# Patient Record
Sex: Male | Born: 1945 | Race: White | Hispanic: No | Marital: Married | Smoking: Former smoker
Health system: Southern US, Community
[De-identification: ages and names within clinical notes are randomized; demographics above are authoritative.]

## PROBLEM LIST (undated history)

## (undated) DIAGNOSIS — N200 Calculus of kidney: Secondary | ICD-10-CM

## (undated) DIAGNOSIS — T4145XA Adverse effect of unspecified anesthetic, initial encounter: Secondary | ICD-10-CM

## (undated) DIAGNOSIS — M2550 Pain in unspecified joint: Secondary | ICD-10-CM

## (undated) DIAGNOSIS — I6529 Occlusion and stenosis of unspecified carotid artery: Secondary | ICD-10-CM

## (undated) DIAGNOSIS — T8859XA Other complications of anesthesia, initial encounter: Secondary | ICD-10-CM

## (undated) DIAGNOSIS — D649 Anemia, unspecified: Secondary | ICD-10-CM

## (undated) DIAGNOSIS — I10 Essential (primary) hypertension: Secondary | ICD-10-CM

## (undated) DIAGNOSIS — E785 Hyperlipidemia, unspecified: Secondary | ICD-10-CM

## (undated) DIAGNOSIS — C801 Malignant (primary) neoplasm, unspecified: Secondary | ICD-10-CM

## (undated) DIAGNOSIS — IMO0002 Reserved for concepts with insufficient information to code with codable children: Secondary | ICD-10-CM

## (undated) DIAGNOSIS — J189 Pneumonia, unspecified organism: Secondary | ICD-10-CM

## (undated) DIAGNOSIS — K759 Inflammatory liver disease, unspecified: Secondary | ICD-10-CM

## (undated) HISTORY — PX: COLONOSCOPY: SHX174

## (undated) HISTORY — DX: Essential (primary) hypertension: I10

## (undated) HISTORY — PX: TONSILLECTOMY: SUR1361

## (undated) HISTORY — DX: Anemia, unspecified: D64.9

## (undated) HISTORY — DX: Hyperlipidemia, unspecified: E78.5

## (undated) HISTORY — PX: APPENDECTOMY: SHX54

## (undated) HISTORY — DX: Occlusion and stenosis of unspecified carotid artery: I65.29

## (undated) HISTORY — DX: Malignant (primary) neoplasm, unspecified: C80.1

## (undated) HISTORY — PX: OTHER SURGICAL HISTORY: SHX169

---

## 1998-06-21 ENCOUNTER — Encounter: Admission: RE | Admit: 1998-06-21 | Discharge: 1998-09-19 | Payer: Self-pay | Admitting: Family Medicine

## 1999-01-16 ENCOUNTER — Encounter: Admission: RE | Admit: 1999-01-16 | Discharge: 1999-04-16 | Payer: Self-pay | Admitting: Family Medicine

## 1999-02-05 ENCOUNTER — Encounter: Payer: Self-pay | Admitting: Emergency Medicine

## 1999-02-05 ENCOUNTER — Emergency Department (HOSPITAL_COMMUNITY): Admission: EM | Admit: 1999-02-05 | Discharge: 1999-02-05 | Payer: Self-pay | Admitting: Emergency Medicine

## 2000-07-13 ENCOUNTER — Encounter: Payer: Self-pay | Admitting: Family Medicine

## 2000-07-13 ENCOUNTER — Encounter: Admission: RE | Admit: 2000-07-13 | Discharge: 2000-07-13 | Payer: Self-pay | Admitting: Family Medicine

## 2000-08-17 ENCOUNTER — Ambulatory Visit (HOSPITAL_COMMUNITY): Admission: RE | Admit: 2000-08-17 | Discharge: 2000-08-17 | Payer: Self-pay | Admitting: Gastroenterology

## 2000-08-17 ENCOUNTER — Encounter (INDEPENDENT_AMBULATORY_CARE_PROVIDER_SITE_OTHER): Payer: Self-pay | Admitting: *Deleted

## 2005-06-09 ENCOUNTER — Encounter: Admission: RE | Admit: 2005-06-09 | Discharge: 2005-06-09 | Payer: Self-pay | Admitting: Gastroenterology

## 2005-12-24 ENCOUNTER — Encounter: Payer: Self-pay | Admitting: Cardiovascular Disease

## 2005-12-24 ENCOUNTER — Inpatient Hospital Stay (HOSPITAL_COMMUNITY): Admission: EM | Admit: 2005-12-24 | Discharge: 2005-12-25 | Payer: Self-pay | Admitting: Emergency Medicine

## 2005-12-24 ENCOUNTER — Ambulatory Visit: Payer: Self-pay | Admitting: Cardiovascular Disease

## 2006-01-30 ENCOUNTER — Ambulatory Visit: Payer: Self-pay | Admitting: Internal Medicine

## 2006-03-04 ENCOUNTER — Ambulatory Visit: Payer: Self-pay | Admitting: Internal Medicine

## 2007-07-26 ENCOUNTER — Ambulatory Visit: Payer: Self-pay | Admitting: Internal Medicine

## 2007-11-30 ENCOUNTER — Ambulatory Visit: Payer: Self-pay | Admitting: Vascular Surgery

## 2007-12-28 ENCOUNTER — Ambulatory Visit: Admission: RE | Admit: 2007-12-28 | Discharge: 2008-01-25 | Payer: Self-pay | Admitting: Radiation Oncology

## 2008-01-03 ENCOUNTER — Encounter: Admission: RE | Admit: 2008-01-03 | Discharge: 2008-04-02 | Payer: Self-pay | Admitting: Family Medicine

## 2008-01-28 DIAGNOSIS — C801 Malignant (primary) neoplasm, unspecified: Secondary | ICD-10-CM

## 2008-01-28 HISTORY — DX: Malignant (primary) neoplasm, unspecified: C80.1

## 2008-02-16 ENCOUNTER — Ambulatory Visit: Payer: Self-pay | Admitting: Internal Medicine

## 2008-03-09 ENCOUNTER — Encounter (INDEPENDENT_AMBULATORY_CARE_PROVIDER_SITE_OTHER): Payer: Self-pay | Admitting: Urology

## 2008-03-09 ENCOUNTER — Inpatient Hospital Stay (HOSPITAL_COMMUNITY): Admission: RE | Admit: 2008-03-09 | Discharge: 2008-03-10 | Payer: Self-pay | Admitting: Urology

## 2008-03-09 HISTORY — PX: PROSTATE SURGERY: SHX751

## 2008-05-03 ENCOUNTER — Encounter: Admission: RE | Admit: 2008-05-03 | Discharge: 2008-05-03 | Payer: Self-pay | Admitting: Family Medicine

## 2008-06-13 ENCOUNTER — Ambulatory Visit: Payer: Self-pay | Admitting: Vascular Surgery

## 2008-07-25 ENCOUNTER — Encounter: Admission: RE | Admit: 2008-07-25 | Discharge: 2008-07-25 | Payer: Self-pay | Admitting: Family Medicine

## 2008-11-15 ENCOUNTER — Encounter: Admission: RE | Admit: 2008-11-15 | Discharge: 2009-01-24 | Payer: Self-pay | Admitting: Family Medicine

## 2008-12-19 ENCOUNTER — Ambulatory Visit: Payer: Self-pay | Admitting: Vascular Surgery

## 2009-02-22 ENCOUNTER — Encounter: Admission: RE | Admit: 2009-02-22 | Discharge: 2009-02-22 | Payer: Self-pay | Admitting: Family Medicine

## 2009-06-29 ENCOUNTER — Ambulatory Visit: Payer: Self-pay | Admitting: Vascular Surgery

## 2010-01-04 ENCOUNTER — Ambulatory Visit: Payer: Self-pay | Admitting: Vascular Surgery

## 2010-02-17 ENCOUNTER — Encounter: Payer: Self-pay | Admitting: Gastroenterology

## 2010-03-07 IMAGING — CR DG CHEST 2V
2 series · 2 of 2 positions shown · non-contrast
Comparison: 12/23/2005

CLINICAL DATA: Prostate carcinoma, hypertension, preop

CHEST - 2 VIEW

[w chest pa]
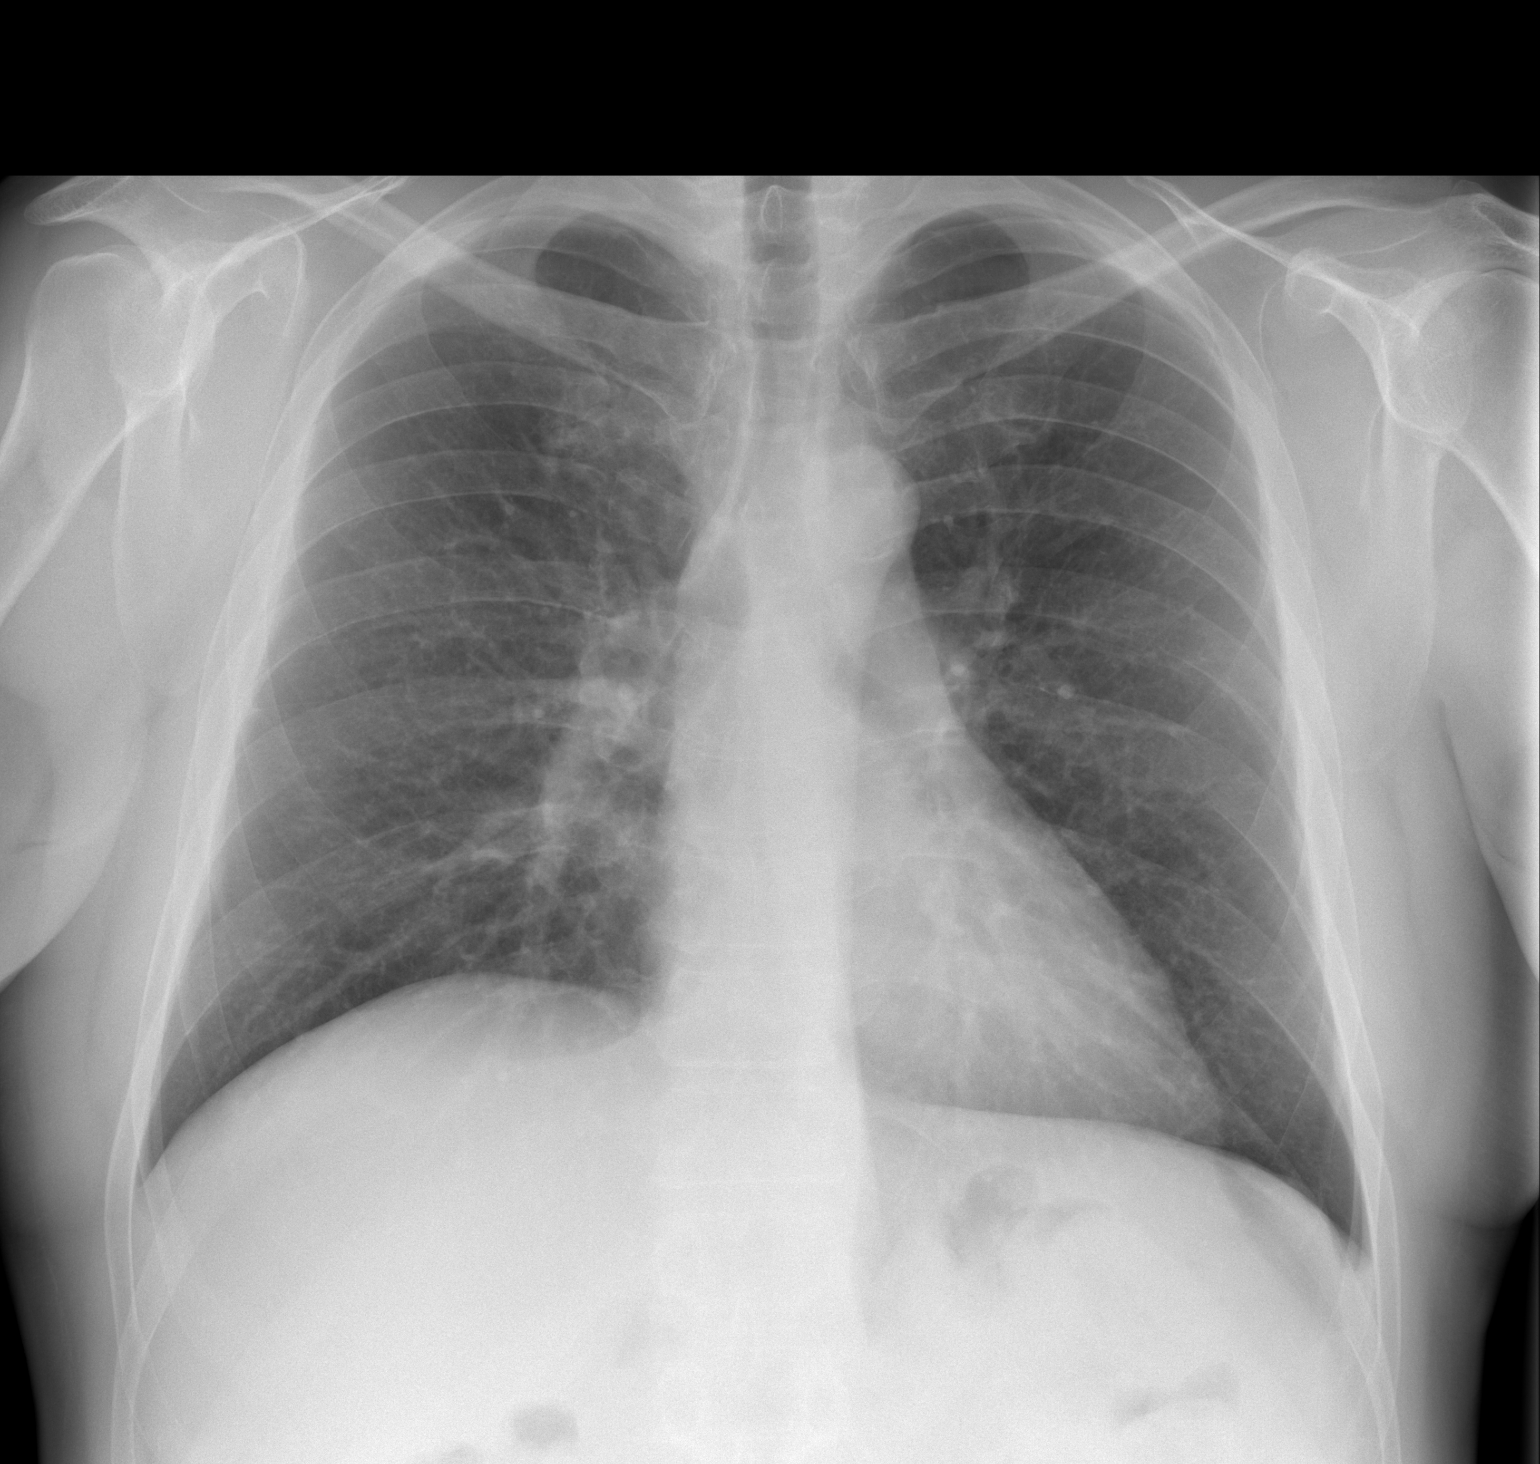

[w chest lat]
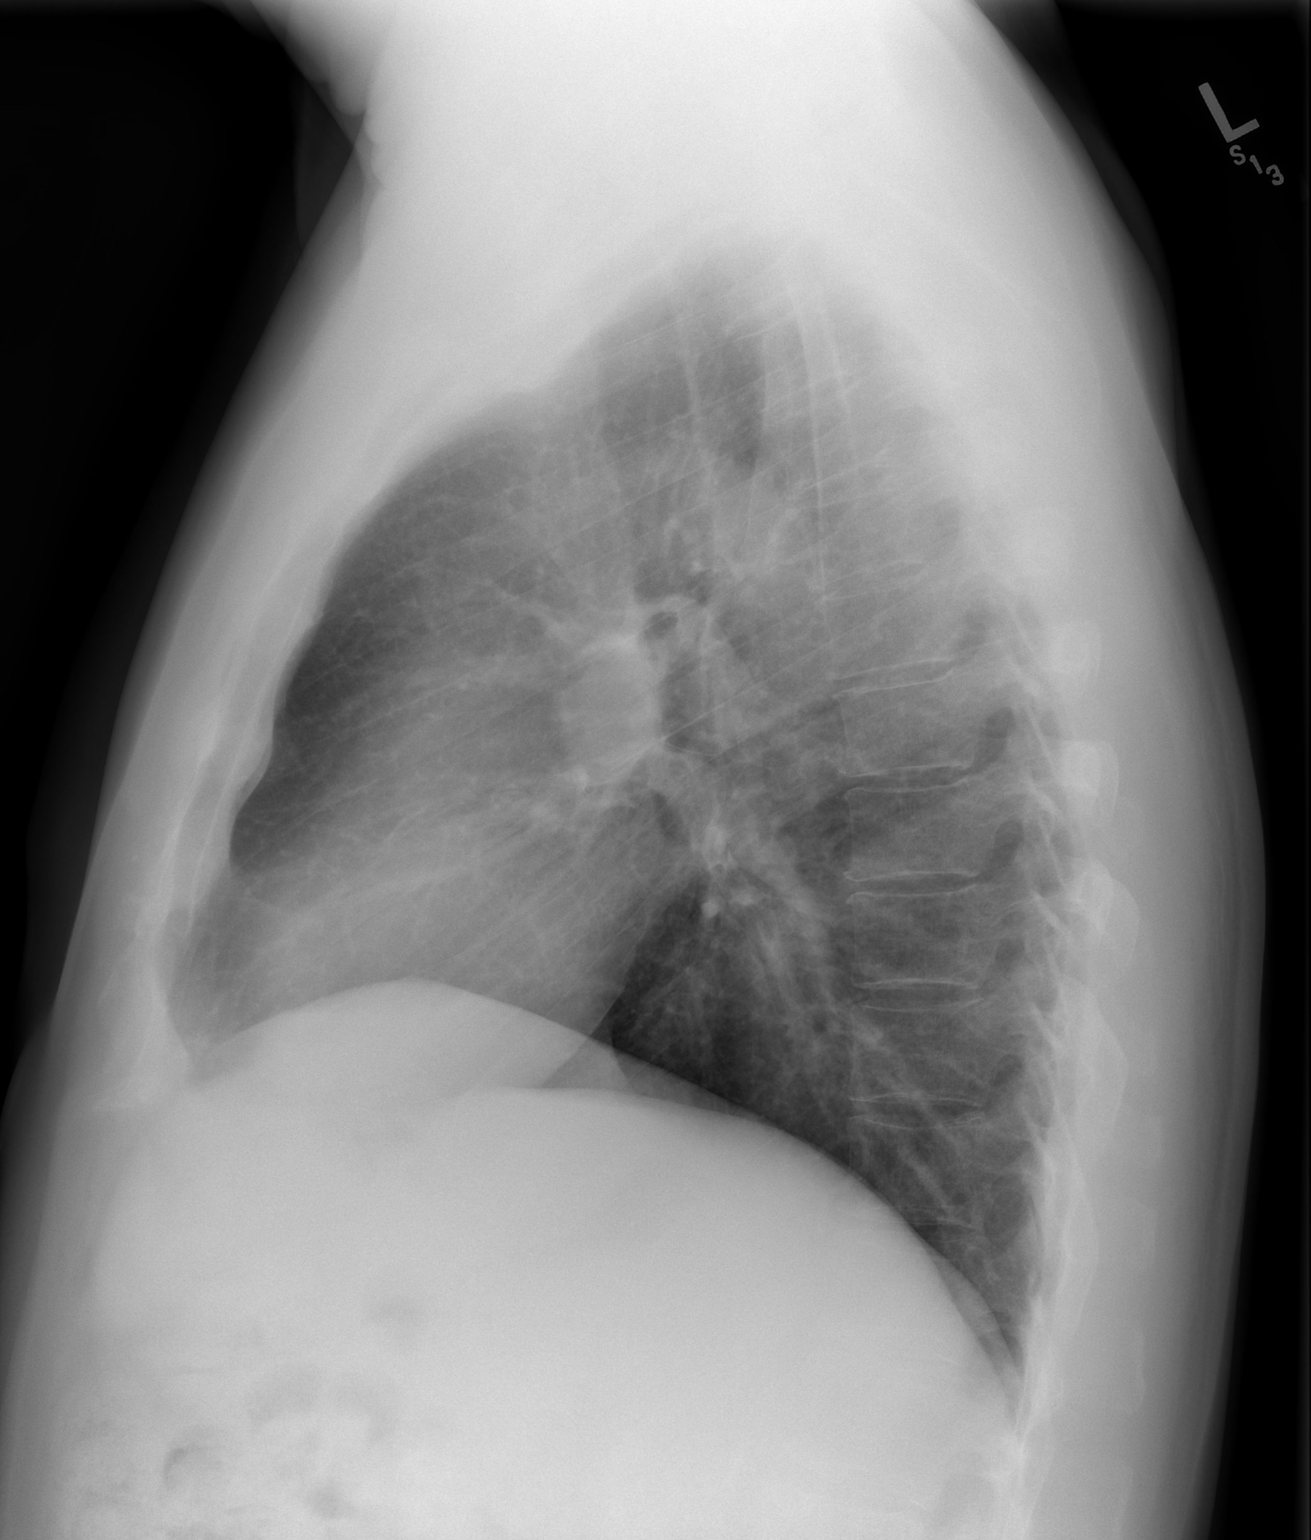

[2 of 2 positions shown; findings below may reference images not displayed]

FINDINGS: Lungs clear.  Heart size and pulmonary vascularity
normal.  No effusion.  Visualized bones unremarkable.
IMPRESSION: No acute disease

REF:W2 DICTATED: 03/07/2008 [DATE]

## 2010-05-14 LAB — CBC
Hemoglobin: 16.5 g/dL (ref 13.0–17.0)
MCHC: 34.8 g/dL (ref 30.0–36.0)
Platelets: 138 10*3/uL — ABNORMAL LOW (ref 150–400)
RDW: 12.8 % (ref 11.5–15.5)

## 2010-05-14 LAB — TYPE AND SCREEN
ABO/RH(D): O POS
Antibody Screen: NEGATIVE

## 2010-05-14 LAB — BASIC METABOLIC PANEL
BUN: 14 mg/dL (ref 6–23)
CO2: 26 mEq/L (ref 19–32)
Calcium: 10.5 mg/dL (ref 8.4–10.5)
Creatinine, Ser: 0.81 mg/dL (ref 0.4–1.5)
GFR calc non Af Amer: >60 mL/min (ref >60)
Glucose, Bld: 147 mg/dL — ABNORMAL HIGH (ref 70–99)
Sodium: 139 mEq/L (ref 135–145)

## 2010-05-14 LAB — GLUCOSE, CAPILLARY
Glucose-Capillary: 123 mg/dL — ABNORMAL HIGH (ref 70–99)
Glucose-Capillary: 157 mg/dL — ABNORMAL HIGH (ref 70–99)
Glucose-Capillary: 176 mg/dL — ABNORMAL HIGH (ref 70–99)
Glucose-Capillary: 235 mg/dL — ABNORMAL HIGH (ref 70–99)

## 2010-05-14 LAB — HEMOGLOBIN AND HEMATOCRIT, BLOOD
HCT: 38.4 % — ABNORMAL LOW (ref 39.0–52.0)
HCT: 39.1 % (ref 39.0–52.0)
Hemoglobin: 13.7 g/dL (ref 13.0–17.0)

## 2010-06-11 NOTE — Procedures (Signed)
CAROTID DUPLEX EXAM   INDICATION:  Follow up known carotid artery disease.   HISTORY:  Diabetes:  Yes.  Cardiac:  No.  Hypertension:  Yes.  Smoking:  Quit about 20 years ago.  Previous Surgery:  No.  CV History:  No.  Amaurosis Fugax No, Paresthesias No, Hemiparesis No.                                       RIGHT             LEFT  Brachial systolic pressure:         138               154  Brachial Doppler waveforms:         Normal            Normal  Vertebral direction of flow:        Antegrade         Antegrade  DUPLEX VELOCITIES (cm/sec)  CCA peak systolic                   87                82  ECA peak systolic                   113               130  ICA peak systolic                   104               244  ICA end diastolic                   40                75  PLAQUE MORPHOLOGY:                  Calcific          Heterogenous  PLAQUE AMOUNT:                      Mild              Moderate  PLAQUE LOCATION:                    Bulb              ICA   IMPRESSION:  1. Right internal carotid artery suggests 20-39% stenosis.  2. Left internal carotid artery suggests 60-79% stenosis.  3. Bilateral vertebrals are noted with antegrade flow.   ___________________________________________  [name] [place], M.D.   CB/MEDQ  D:  12/19/2008  T:  12/19/2008  Job:  [id number]

## 2010-06-11 NOTE — Consult Note (Signed)
VASCULAR SURGERY CONSULTATION   [name], [name]  DOB:  1945/10/19                                       11/30/2007  YQIHK#:[id number]   The patient was referred for vascular surgery consultation for carotid  occlusive disease by Dr. Duaine Dredge.  This is a 65 year old healthy male  patient who has had carotid disease followed by duplex scanning by Dr.  Duaine Dredge at [facility].  The most recent  scan was performed on 10/20/2007 and was interpreted as a 70-99% left  internal carotid stenosis with minimal flow reduction on the right side.  The patient denies any neurologic symptoms such as hemispheric TIAs  including and hemiparesis, aphasia, amaurosis fugax, diplopia, blurred  vision, syncope.  He has no history of stroke.   PAST MEDICAL HISTORY:  1. Non-insulin-dependent diabetes mellitus for 8-9 years.  2. Hypertension.  3. Hyperlipidemia.  4. Negative for coronary artery disease, stroke or COPD.   PAST SURGICAL HISTORY:  1. Appendectomy.  2. Tonsillectomy.   FAMILY HISTORY:  Strongly positive for coronary artery disease in his  mother and father who had a myocardial infarction at age 82 and died at  age 45 and also a brother who had coronary artery bypass grafting.  Also  positive for stroke in his brother and two grandfathers and diabetes in  his father.   SOCIAL HISTORY:  He is married.  He has two children.  Works in [employer].  He has not smoked since age 58 and only did so for a few years.  He does  not use alcohol on a regular basis.   REVIEW OF SYSTEMS:  Is negative.  Denies any claudication, chest pain,  dyspnea on exertion, PND, orthopnea or other GI, GU or neurologic  symptoms.   ALLERGIES:  None known.   MEDICATIONS:  Please see health history form but this does include  aspirin 81 mg b.i.d.   PHYSICAL EXAMINATION:  Vital signs:  Blood pressure is 152/88, heart  rate is 105, respirations 14.  General:  A  healthy-appearing middle-aged  male in no apparent distress, alert and oriented x3.  Neck:  Supple, 3+  carotid pulses.  There is a harsh bruit on the left side over the  carotid bifurcation.  Neurologic:  Normal.  No palpable adenopathy in  the neck.  Chest:  Clear to auscultation.  Cardiovascular:  Regular  rhythm.  No murmurs.  Abdomen:  Soft, nontender with no palpable masses.  He has 3+ femoral, popliteal, dorsalis pedis and posterior tibial pulses  bilaterally with no distal edema.   I reviewed the report of the carotid duplex exam and based on velocities  feel that the lesion in his left carotid is probably in the 70-75% range  in severity.  It is asymptomatic.  We will need to continue to follow  this closely and I plan to see him in 6 months with a followup carotid  duplex exam in our office for comparison.  If he develops any neurologic  symptoms in the interim he will be in touch with me.   Quita Skye Hart [place], M.D.  Electronically Signed  JDL/MEDQ  D:  11/30/2007  T:  12/01/2007  Job:  [id number]   cc:   Mosetta Putt, M.D.

## 2010-06-11 NOTE — Op Note (Signed)
[name], [name]              ACCOUNT NO.:  [id number]   MEDICAL RECORD NO.:  [id number]          PATIENT TYPE:  INP   LOCATION:  0001                         FACILITY:  [facility]   PHYSICIAN:  Heloise Purpura, MD      DATE OF BIRTH:  09/22/1945   DATE OF PROCEDURE:  03/09/2008  DATE OF DISCHARGE:                               OPERATIVE REPORT   PREOPERATIVE DIAGNOSIS:  Clinically localized adenocarcinoma of prostate  (clinical stage T2a Nx Mx).   POSTOPERATIVE DIAGNOSIS:  Clinically localized adenocarcinoma of  prostate (clinical stage T2a NX MX).   PROCEDURE:  1. Robotic-assisted laparoscopic radical prostatectomy (bilateral      nerve sparing).  2. Bilateral laparoscopic pelvic lymphadenectomy.   SURGEON:  Heloise Purpura, M.D.   FIRST ASSISTANT:  Delia Chimes, nurse practitioner.   SECOND ASSISTANT:  Duane Boston, M.D.   ANESTHESIA:  General.   COMPLICATIONS:  None.   ESTIMATED BLOOD LOSS:  150 mL.   SPECIMENS:  1. Prostate and seminal vesicles.  2. Right pelvic lymph nodes.  3. Left pelvic lymph nodes.   DISPOSITION OF SPECIMENS:  To pathology.   DRAINS:  1. #20-French coude catheter.  2. #19 Blake pelvic drain.   INDICATIONS:  [name] is a 65 year old gentleman with clinically  localized adenocarcinoma of the prostate.  After undergoing an extensive  discussion and counseling process regarding his management options for  treatment, he elected to proceed with surgical therapy and the above  procedures.  The potential risks, complications, and alternative options  were discussed in detail and informed consent was obtained.   DESCRIPTION OF PROCEDURE:  The patient was taken to the operating room  and a general anesthetic was administered.  He was given preoperative  antibiotics, placed in the dorsal lithotomy position, and prepped and  draped in the usual sterile fashion.  Next, a preoperative time-out was  performed.  A Foley catheter was then inserted  into the bladder and a  site was selected just inferior to the umbilicus in the midline for  placement of the camera port.  This was placed using a standard open  Hassan technique which allowed entry in the peritoneal cavity under  direct vision and without difficulty.  A 12-mm port was then placed and  a pneumoperitoneum established.  The 0-degrees lens was then used to  inspect the abdomen and there was no evidence for any intra-abdominal  injuries or other abnormalities.  The remaining ports were then placed.  Bilateral 8-mm robotic ports were placed 10 cm lateral to and just  inferior to the camera port site.  An additional 8-mm robotic port was  placed in the far left lateral abdominal wall.  A 5-mm port was placed  between the camera port and the right robotic port.  A 12-mm port was  placed in the far right lateral abdominal wall for laparoscopic  assistance.  All ports were placed under direct vision and without  difficulty.  The surgical cart was then docked.  With the aid of the  cautery scissors, the bladder was reflected posteriorly, allowing entry  into space of  Retzius and identification of the endopelvic fascia and  prostate.  The endopelvic fascia was then incised from the apex back to  the base of the prostate bilaterally, and the underlying levator muscle  fibers were swept laterally off the prostate, thereby isolating the  dorsal venous complex.  The dorsal vein was then stapled and divided  with the 45-mm Flex ETS stapler.  The bladder neck was identified with  the aid of Foley catheter manipulation and was divided anteriorly,  thereby allowing entry into the bladder and exposure of the Foley  catheter.  The catheter balloon was deflated and the catheter was  brought into the operative field and used to retract the prostate  anteriorly.  The posterior bladder neck was then divided and dissection  proceeded between the bladder and prostate posteriorly until the vasa   deferentia and seminal vesicles were identified.  The vasa deferentia  were isolated, divided and lifted anteriorly.  The seminal vesicles were  then dissected down to their tips, with care to control the seminal  vesicle arterial blood supply with Weck clips.  The seminal vesicles  were then lifted anteriorly in the space between Denonvilliers fascia  and the anterior rectum was bluntly developed, thereby isolating the  vascular pedicles of the prostate.  The lateral prostatic fascia was  then incised bilaterally, allowing the neurovascular bundles to be  released.  The neurovascular bundles were then separated off the apex of  the prostate and the urethra.  The vascular pedicles of the prostate  were then ligated with Hem-o-lok clips above the level of the  neurovascular bundles, allowing the vascular pedicles of the prostate to  be ligated with cold sharp scissor dissection.  The urethra was then  sharply divided, allowing the specimen to be disarticulated.  The pelvis  was copiously irrigated and there were noted to be 2 areas of venous  bleeding toward the distal aspect of the neurovascular bundle on either  side.  These were controlled with figure-of-eight 3-0 Vicryl sutures,  with care to preserve the neurovascular bundles themselves.  Attention  then turned to the right pelvic sidewall.  The fibrofatty tissue between  the external iliac vein, confluence of the iliac vessels, hypogastric  artery, and Cooper ligament was dissected free off of the pelvic  sidewall, with care to preserve the obturator nerve.  Hem-o-lok clips  were used for lymphostasis and hemostasis.  An identical procedure was  performed on the contralateral side and both lymphatic packets were  removed for permanent pathologic analysis.  Attention then turned to the  urethral anastomosis.  A 2-0 Vicryl slipknot was placed between  Denonvilliers fascia, the posterior bladder neck, and the posterior  urethra to  reapproximate these structures.  A double-armed 3-0 Monocryl  suture was then used to perform a 360-degree running tension-free  anastomosis between the bladder neck and urethra.  A new 20-French coude  catheter was inserted into the bladder and the catheter was irrigated.  The anastomosis appeared to be watertight and there were no blood clots  within the bladder.  A #19 Blake drain was then brought through the left  robotic port and was positioned appropriately within the pelvis.  The  surgical cart was then undocked.  The right lateral 12-mm port site was  closed with a 0 Vicryl suture placed with the aid of the suture passer  device.  All remaining ports were removed under direct vision and the  prostate specimen was removed intact within the Endopouch  retrieval bag  via the midline port site.  This fascial opening was then closed with a  running 0 Vicryl suture.  All incision sites were injected with 0.25%  Marcaine and reapproximated at the skin level with staples.  Sterile  dressings were applied.  The patient appeared to tolerate the procedure  without complications and was able to be transferred to the recovery  unit in satisfactory condition.     Heloise Purpura, MD  Electronically Signed    LB/MEDQ  D:  03/09/2008  T:  03/09/2008  Job:  [id number]

## 2010-06-11 NOTE — Letter (Signed)
February 16, 2008    [name] Purpura, MD  [address] [place], [place] [address]   RE:  Graham, [name]  MRN:  [id number]  /  DOB:  02-26-1945   Dear Dr. Laverle Patter:   I look forward to putting a phase to the name.  [name] comes in at  your request for clearance for his upcoming prostatectomy.   He is a 65 year old gentleman with diabetes and hypertension and  palpitations.  He has had no problems with chest discomfort, exercise  intolerance, or shortness of breath.   His cardiac evaluation included a Myoview scan 2 years ago demonstrating  normal left ventricular function and no ischemia.   Electrocardiogram today was normal.   CURRENT MEDICATIONS:  1. Verapamil 240.  2. Chlorthalidone 25.  3. Cozaar 100.  4. Metformin 2000.  5. Aspirin.  6. Glipizide 10 b.i.d.  7. Potassium.   PHYSICAL EXAMINATION:  VITAL SIGNS:  His blood pressure is 136/82, his  pulse is 89, his weight was 213, which is down 15 pounds since February  2008 and down 10 pounds since June.  HEENT:  Demonstrated no icterus or xanthoma.  NECK:  Veins were flat.  Carotids were brisk and full bilaterally  without bruits.  BACK:  Without kyphosis, scoliosis.  LUNGS:  Clear.  HEART:  Sounds were regular without murmurs or gallops.  ABDOMEN:  Soft with active bowel sounds without midline pulsation or  hepatomegaly.  EXTREMITIES:  Femoral pulses were 2+.  Distal pulses were intact.  No  clubbing, cyanosis, or edema.  NEUROLOGIC:  Grossly normal.  SKIN:  Warm and dry.   Electrocardiogram today as noted above was normal with intervals of  0.13/0.19/0.35 with a rate of 89.   IMPRESSION:  1. Palpitations with previously identified atrial ectopy.  2. Cardiac risk factors noted for diabetes, hypertension, dyslipidemia      with negative Myoview scan in January 2008.  3. Prostate cancer with need for pending prostatectomy.   [name] is doing quite well at this time.  He should be at a very  acceptable  cardiovascular risk for his surgery.   Please let us know if there is anything we can do to help.    Sincerely,      Duke Salvia, MD, [facility]  Electronically Signed    SCK/MedQ  DD: 02/16/2008  DT: 02/17/2008  Job #: [id number]

## 2010-06-11 NOTE — Letter (Signed)
July 26, 2007    Mosetta Putt, M.D.  [address] [place], [place] [address]   RE:  [name], Graham  MRN:  [id number]  /  DOB:  [birth year]   Dear Theron Arista,   It was a pleasure to see [name] today.  He comes in in followup  for palpitations and brings with him an electrocardiogram that he  obtained from your office where it demonstrated atrial trigeminy that  was not associated his typical symptoms.   He also was concerned about his blood pressure with the diastolics in  the mid 90s.   MEDICATIONS:  His medications currently include:  1. Verapamil 240 mg on 07/22/2007.  2. Cozaar 100 for his blood pressure.  3. Metformin 2000 a day.  4. Recently added Glucotrol for his diabetes.  5. He is also on aspirin now 81 b.i.d. as well as Lipitor.   PHYSICAL EXAMINATION:  His blood pressure was 138/99; today his pulse  was 97; and his weight was 222, which is down about 7 pounds since year  and a half ago.  LUNGS:  Clear.  HEART:  Sounds were regular.  EXTREMITIES:  Without edema.  SKIN:  Warm and dry.   IMPRESSION:  1. Palpitations, question mechanism, prior premature ventricular      contractions.  2. Atrial trigeminy, unassociated with palpitations.  3. Hypertension.  4. Diabetes.   Mr. [name], [name], is doing fairly well.  He is concerned about his  blood pressure as MI especially in the context of his diabetes.  I have  asked that he use his home blood pressure machine 3 to 4 times per week  in anticipation of him seeing you in about 2 months' time.  He has a  history of white coat hypertension and labile hypertension, so I am not  quite sure how typical the elevated blood pressure is today.  It may be  that a beta-blocker as an alternative and a calcium blocker might allow  [place] to address both his palpitations as well as his blood pressure, but I  will defer that to you when you see him again.   We will plan to see him again in one year's time, and he is to  call, if  there are issues in the interim.    Sincerely,      Duke Salvia, MD, [facility]  Electronically Signed    SCK/MedQ  DD: 07/26/2007  DT: 07/27/2007  Job #: [id number]

## 2010-06-11 NOTE — Procedures (Signed)
CAROTID DUPLEX EXAM   INDICATION:  Carotid disease.   HISTORY:  Diabetes:  Yes.  Cardiac:  No.  Hypertension:  Yes.  Smoking:  Previous.  Previous Surgery:  No.  CV History:  Currently asymptomatic.  Amaurosis Fugax No, Paresthesias No, Hemiparesis No                                       RIGHT             LEFT  Brachial systolic pressure:         132               134  Brachial Doppler waveforms:         Normal            Normal  Vertebral direction of flow:        Antegrade         Antegrade  DUPLEX VELOCITIES (cm/sec)  CCA peak systolic                   82                77  ECA peak systolic                   110               105  ICA peak systolic                   85                255  ICA end diastolic                   33                99  PLAQUE MORPHOLOGY:                  Mixed             Mixed  PLAQUE AMOUNT:                      Mild              Moderate / severe  PLAQUE LOCATION:                    ICA               ICA / distal CCA   IMPRESSION:  1. 1%-39% stenosis of the right internal carotid artery.  2. 60%-79% stenosis of the left internal carotid artery.  3. No significant change noted when compared to the previous exam on      12/19/2008.   ___________________________________________  Quita Skye. Hart [place], M.D.   CH/MEDQ  D:  06/29/2009  T:  06/30/2009  Job:  [id number]

## 2010-06-11 NOTE — Procedures (Signed)
CAROTID DUPLEX EXAM   INDICATION:  Carotid disease.   HISTORY:  Diabetes:  Yes.  Cardiac:  No.  Hypertension:  Yes.  Smoking:  Previous.  Previous Surgery:  No.  CV History:  Currently asymptomatic.  Amaurosis Fugax No, Paresthesias No, Hemiparesis No.                                       RIGHT             LEFT  Brachial systolic pressure:         140               152  Brachial Doppler waveforms:         Normal            Normal  Vertebral direction of flow:        Antegrade         Antegrade  DUPLEX VELOCITIES (cm/sec)  CCA peak systolic                   77                86  ECA peak systolic                   101               83  ICA peak systolic                   76                233  ICA end diastolic                   27                82  PLAQUE MORPHOLOGY:                  Mixed             Mixed  PLAQUE AMOUNT:                      Mild              Moderate/severe  PLAQUE LOCATION:                    ICA               ICA/CCA   IMPRESSION:  1. No hemodynamically significant stenosis of the right internal      carotid artery with plaque formations as described above.  2. Doppler velocities suggest a 60% to 79% stenosis of the left      proximal internal carotid artery.  3. No significant change noted when compared to the previous      examination on 06/29/2009.   ___________________________________________  Quita Skye. Hart [place], M.D.   CH/MEDQ  D:  01/10/2010  T:  01/10/2010  Job:  [id number]

## 2010-06-11 NOTE — Procedures (Signed)
CAROTID DUPLEX EXAM   INDICATION:  Follow up known carotid artery disease.   HISTORY:  Diabetes:  Yes.  Cardiac:  No.  Hypertension:  Yes.  Smoking:  Quit about 20 years ago.  Previous Surgery:  CV History:  Amaurosis Fugax No, Paresthesias No, Hemiparesis No.                                       RIGHT             LEFT  Brachial systolic pressure:         140               150  Brachial Doppler waveforms:         Biphasic          Biphasic  Vertebral direction of flow:        Antegrade         Antegrade  DUPLEX VELOCITIES (cm/sec)  CCA peak systolic                   78                67  ECA peak systolic                   106               79  ICA peak systolic                   72                289  ICA end diastolic                   27                91  PLAQUE MORPHOLOGY:                  Heterogenous      Calcified  PLAQUE AMOUNT:                      Mild              Moderate-to-severe  PLAQUE LOCATION:                    ICA, ECA          ICA, ECA   IMPRESSION:  1. 60-79% stenosis (high end of range) noted in the left internal      carotid artery.  2. 20-39% stenosis noted in the right internal carotid artery.  3. Antegrade bilateral vertebral arteries.   ___________________________________________  Quita Skye Hart [place], M.D.   MG/MEDQ  D:  06/13/2008  T:  06/13/2008  Job:  [id number]

## 2010-06-14 NOTE — Discharge Summary (Signed)
[name], [name] NO.:  [id number]   MEDICAL RECORD NO.:  [id number]          PATIENT TYPE:  INP   LOCATION:  3729                         FACILITY:  [facility]   PHYSICIAN:  Hettie Holstein, D.O.    DATE OF BIRTH:  05-22-1945   DATE OF ADMISSION:  12/23/2005  DATE OF DISCHARGE:  12/25/2005                               DISCHARGE SUMMARY   REASON FOR ADMISSION:  Palpitations and chest pain.   PRINCIPAL DIAGNOSES:  1. Chest pain without evidence of acute ischemic  event during this      hospitalization.  2. Palpitations without recurrence during the inpatient setting,      status post telemetry monitoring during his short hospital course      and underwent evaluation of electrolytes as well as evaluation for      pulmonary embolus which was negative.  3. Poorly controlled diabetes mellitus with hemoglobin A1c of 7.9 at      admission.  We did offer initiation of additional oral therapies.      [name] now is deferred this to management as an outpatient.      Additionally, he has a great deal of confidence in Dr. Duaine Dredge and      feels as though he wishes to have this under his direction.  4. Mildly elevated LFTs.  [name] states that he has been      following along with Dr. Duaine Dredge for this.  He additionally is on      Lipitor at this time.  5. Transient hypokalemia.  __________ holding of his diuretic for now.      __________ a contributing factor for his development of      palpitations as delineated in the history of present illness.  6. No history of hyperlipidemia.   LABORATORY DATA:  His cardiac markers were unremarkable __________ noted  above.  __________ were repeated during his hospital course.  __________  4.4.  Urinalysis was unremarkable with the exception of microscopic  hematuria which will be followed in the outpatient setting by his  primary physician.  WBC of 5.5, hemoglobin 14.4, platelet count in the  low range 117 again so this  does warrant follow up blood work within 1  week.  Hemoglobin A1c was 7.9.  His LDL with 5 HDL was 115.  TSH, as  noted above, was 0.64.  BNP was less than 30.  His chest x-ray revealed  only minimal bronchitic changes.   MEDICATIONS ON DISCHARGE:  As noted above, he is to continue his home  medications as he was on prior to admission including:  1. Potassium 20 mEq daily.  2. Lipitor 10 mg daily.  3. Verapamil 240 mg daily.  4. Chlorthalidone.  I am recommending that he hold this medication.  5. Cozaar 100 mg daily which he can continue.  6. Metformin 2 g p.o. q.h.s. as before.  7. Naproxen 220 mg daily to use with discretion with a palpitation      with Cozaar or any other ARB or ACE inhibitor.  8. Aspirin 81 mg daily.  9. Multivitamin  daily.  10.Folic acid daily.  11.Co-Q 10 as he was on prior to admission.   DISPOSITION:  [name] states that he will follow up with Dr.  Duaine Dredge this week.  Under history of present illness for April 2000,  please refer to the H&P as dictated by Dr. Elliot Cousin.  However,  briefly [name] is a pleasant 65 year old male, who walks on a  routine and regular basis who felt some palpitations overnight and the  early a.m. the day preceding admission.  These persisted, and he  subsequently presented to the ED with left-sided chest pain.  He  underwent EKG which was not revealing of acute ischemic event.  The  tracing was normal sinus rhythm.  His cardiac markers were cycled, and  his hospital course was otherwise uneventful.  Routine laboratory  screening and studies have revealed that he has some mildly elevated  LFTs which he conveys to [place] that he had been following with Dr. Matthias Hughs  for this.  His chest x-ray only revealed minimal bronchitic changes, as  noted above.  He underwent 2 D echocardiogram which revealed ejection  fraction of 55%.  Urinalysis, again, as noted above, revealed some  microscopic hematuria.  At this time, [name]  does not have  recurrent signs or symptom and is chest pain free.  It is felt that  further evaluation and work-up of these various lab abnormalities can be  pursued in the outpatient setting.  I would recommend a follow up basic  metabolic panel to ensure his electrolytes are remaining stable 1 week  from discharge, in addition to a CBC to assure that his platelet counts  are also remaining stable.      Hettie Holstein, D.O.  Electronically Signed     ESS/MEDQ  D:  12/25/2005  T:  12/25/2005  Job:  [id number]   cc:   Mosetta Putt, M.D.

## 2010-06-14 NOTE — Procedures (Signed)
[place]  Patient:    [name], [name]                     MRN: [id number] Proc. Date: 08/17/00 Adm. Date:  [id number] Attending:  Rich Brave CC:         Carolyne Fiscal, M.D.   Procedure Report  PROCEDURE PERFORMED:  Colonoscopy with hot biopsy.  ENDOSCOPIST:  Florencia Reasons, M.D.  INDICATIONS FOR PROCEDURE:  Family history of colon cancer in his mother.  FINDINGS:  Diminutive polyp at 28 cm.  DESCRIPTION OF PROCEDURE:  The nature, purpose and risks of the procedure had been discussed with the patient, who provided written consent.  Sedation was fentanyl 100 mcg and Versed 10 mg IV without arrhythmias or desaturation. Digital exam of the prostate was unremarkable.  The Olympus adult video colonoscope was advanced with some difficulty due to fixation of the sigmoid colon, despite the absence of any evident diverticular disease.  Ultimately, the scope was able to be advanced to the cecum, as identified by clear visualization of the appendiceal orifice.  We placed the patient in the supine position and then the right lateral decubitus position and used some external abdominal compression to reach the base of the cecum.  During pullback, the quality of the prep was excellent except in the cecum where there was a little bit of stool film but much of this was able to be irrigated away and it is not felt that any significant lesions would have been missed.  Pull-back was then performed.  At 28 cm from the external anal opening, I encountered a 3 mm sessile polyp removed by a single hot biopsy with complete hemostasis and no evidence of excessive cautery.  No other polyps were seen and there was no evidence of cancer, colitis, vascular malformations or diverticulosis.  Retroflexion in the rectum was unremarkable.  The exam was technically a little bit difficult getting around turns and advancing, due apparent fixation of the  colon as noted above, and the patient vocalized pain on and off during the course of the procedure but it seemed to be advancing quite smoothly and I wanted to avoid excessive sedation and felt that we had probably reached of amnesia anyway, so further sedation was not administered.  The patient overall tolerated the procedure well despite the above-mentioned complaints of pain.  There were no apparent complications.  IMPRESSION:  Diminutive polyp in the sigmoid region, otherwise normal colonoscopy.  PLAN: 1. Await pathology on polyp. 2. Anticipate colonoscopic follow-up in five years regardless of the histology    of the polyp in view of his family history of colon cancer. DD:  08/17/00 TD:  08/18/00 Job: [id number]

## 2010-06-14 NOTE — H&P (Signed)
NAMEANUAR, WALGREN NO.:  [id number]   MEDICAL RECORD NO.:  [id number]          PATIENT TYPE:  INP   LOCATION:  3729                         FACILITY:  [facility]   PHYSICIAN:  Elliot Cousin, M.D.    DATE OF BIRTH:  27-Jan-1946   DATE OF ADMISSION:  12/24/2005  DATE OF DISCHARGE:                              HISTORY & PHYSICAL   PRIMARY CARE PHYSICIAN:  Dr. Mosetta Putt.   CHIEF COMPLAINT:  Palpitations and chest pain.   HISTORY OF PRESENT ILLNESS:  The patient is a 65 year old man with a  past medical history significant for type 2 diabetes mellitus,  hypertension, and hyperlipidemia, who presents to the emergency  department with a 1-day history of chest palpitations and left-sided  chest pain.  The patient states that the palpitations woke him out of  his sleep yesterday morning at approximately 6 o'clock a.m.  The  palpitations were followed shortly after by left-sided chest pain.  The  patient had intermittent shortness of breath that accompanied the chest  pain.  The chest pain is described as dull.  He rates the pain as a 3/10  in intensity.  It was associated with nausea once or twice.  No  associated light-headedness, diaphoresis, or radiation.  The patient  slept for most of the day yesterday; however, his symptoms waxed and  waned.  He has no history of heart disease or irregular heartbeats.  He  is in the process of changing jobs; however, he denies any excessive  anxiety symptoms.  He drinks 4 cups of coffee daily.  He has had no  changes in his medications.   During the evaluation in the emergency department, the patient is noted  to be hemodynamically stable, although initially on arrival to the  emergency department, he was mildly tachycardic with a heart rate of 114  beats per minute.  He was also mildly febrile with a temperature of  100.8.  His chest x-ray reveals minimal bronchitic changes.  His EKG  reveals sinus tachycardia with a heart  rate of 116 beats per minute.  His cardiac markers are negative.  His D-dimer is less than 0.22.  The  patient was given 4 baby aspirin and nitropaste in the emergency  department with very little relief.  He was also given Zofran and  morphine.  Following the administration of the nitropaste and the  administration of IV morphine, the patient's systolic blood pressure  fell in to the 60s.  He was bolused 500 mL of normal saline and the  nitropaste was removed.  His blood pressure is now 99/56.  The patient  will be admitted for further evaluation and management.   PAST MEDICAL HISTORY:  1. Type 2 diabetes mellitus.  2. Hypertension.  3. Hyperlipidemia.  4. Lumbar herniated disk;  followed by neurosurgeon, Dr. Jeral Fruit.  5. Status post appendectomy in the past.  6. Status post tonsillectomy in the past.  7. Hyperplastic polyp per colonoscopy in July 2002 by Dr. Matthias Hughs.      The patient states that he recently underwent a followup  colonoscopy approximately 3 months ago and he was told there were      no polyps.   MEDICATIONS:  1. Potassium chloride 20 mEq daily.  2. Lipitor 10 mg daily.  3. Verapamil 240 mg daily.  4. Chlorthalidone 25 mg daily.  5. Cozaar 100 mg daily.  6. Metformin 2000 mg nightly.  7. Naproxen 220 mg daily.  8. Aspirin 81 mg daily.  9. Multivitamin with iron once daily.  10.Folic acid 400 mg daily.   ALLERGIES:  No known drug allergies.   SOCIAL HISTORY:  The patient is married.  [place]  He is a Scientist, physiological.  He drinks alcohol,  approximately 2 glasses of wine per week.  He denies tobacco and drug  use.   FAMILY HISTORY:  His father died of complications from an intestinal  surgery.  He also had a history of a heart attack at 55 years of age.  His mother died of heart failure.   REVIEW OF SYSTEMS:  The patient's review of systems is positive for  chronic low back pain, otherwise review of systems is  negative.  Specifically, the patient has no complaints of vomiting, dysuria,  diarrhea, painful swelling in his legs.   EXAM:  Temperature 100.8, blood pressure 138/76, pulse 99, respiratory  rate 18, oxygen saturation 100% on room air.  GENERAL:  The patient is a pleasant, 65 year old, Caucasian man who is  currently lying in bed in no acute distress.  HEENT:  Head is normocephalic, non-traumatic.  Pupils equal round and  reactive to light.  Extraocular movements are intact.  Conjunctivae are  clear.  Sclerae are white.  Nasal mucosa is mildly dry.  No sinus  tenderness.  Oropharynx reveals good dentition.  Mucous membranes are  mildly dry.  No posterior exudates.  No erythema.  NECK:  Supple.  No adenopathy.  No thyromegaly.  No bruits.  No JVD.  LUNGS:  Clear to auscultation bilaterally.  HEART:  S1, S2 with no murmurs, rubs, or gallops.  ABDOMEN:  Positive bowel sounds, soft, nontender, nondistended.  No  hepatosplenomegaly.  No masses palpated.  GU AND RECTAL:  Deferred.  EXTREMITIES:  Pedal pulses are 2+ bilaterally.  No pretibial edema and  no pedal edema.  NEUROLOGIC:  The patient is alert and oriented x3.  Cranial nerves II  through XII are intact.  Strength is 5/5 throughout.  Sensation is  intact.   ADMISSION LABORATORIES:  Chest x-ray reveals minimal bronchitic changes.  EKG reveals sinus tachycardia with a heart rate of 116 beats per minute  with no acute ST or T wave abnormalities.  Myoglobin 131.  Troponin-I  less than 0.05.  CK-MB less than 1.  PT 14.5.  D-dimer less than 0.22.  Bicarbonate 25, sodium 136, potassium 3.5, chloride 103, glucose 136,  BUN 17, creatinine 0.9.   ASSESSMENT:  1. Chest pain with palpitations.  The patient is currently      asymptomatic.  His EKG is unremarkable.  His D-dimer is less than      0.22.  His cardiac markers are initially negative.  The patient     certainly has risk factors for cardiac disease including diabetes       mellitus, hypertension, and hyperlipidemia. He will need to be      ruled out for a myocardial infarction.  He is mildly tachycardic      which simply could be secondary to a volume depletion.  However,  a      valvular abnormality and thyroid disease will need to be assessed      and ruled out.  2. Fever.  The patient has a temperature of 100.8.  He has no other      signs or symptoms consistent with infection.  His chest x-ray      reveals no evidence of pneumonia.  His urinalysis will need to be      assessed.  3. Transient hypotension.  The patient's blood pressure fell in to the      60s and 70s systolically following nitropaste and morphine      administration.  It rebounded after administration of a 500 mL      bolus of normal saline.  4. Type 2 diabetes mellitus.  The patient is chronically treated with      Metformin.  5. Hypertension.  The patient's blood pressure is stable.  He is      chronically treated with Verapamil, Chlorthalidone, and Cozaar.   PLAN:  1. The patient will be admitted for further evaluation and management.  2. We will treat the patient's pain with as-needed morphine at a lower      dose and p.r.n. sublingual nitroglycerin.  3. Gentle IV fluids.  4. Continue chronic medications with the exception of Metformin,      Cozaar,  and Chlorthalidone.  These medications can be restarted      accordingly.  5. We will check cardiac enzymes q. 8 h. times 3.  6. We will check a 2-D echocardiogram and a TSH.  7. We will repeat his EKG.  8. We will check an urinalysis, culture, and sensitivity.  9. We will check blood cultures if he re-spikes a fever.      Elliot Cousin, M.D.  Electronically Signed     DF/MEDQ  D:  12/24/2005  T:  12/24/2005  Job:  [id number]

## 2010-06-18 NOTE — Letter (Signed)
January 30, 2006    Mosetta Putt, M.D.  [address] [place]  [place] [address]   RE:  [name], [name]  MRN:  [id number]  /  DOB:  07/22/1945   Dear Theron Arista,   It was a pleasure to see your patient, [name], today in  consultation regarding his chest palpitations.   As you know, he is a nearly 65 year old gentleman who had an episode of  tachy-palpitations upon awakening on the morning of November 27.  This  had happened a few times before but had been of only brief duration.  On  the 27th, they had persisted all day long until about 4:00 in the  afternoon he decided to go to the hospital.  En route, it began to  improve and continued to improve over the ensuing hours.   We tapped this out at about 120 beats per minute.  It was associated  with anxiety but no real shortness of breath or chest discomfort.   He was told he had PVCs there.  Unfortunately, rhythm strips, as you  know, were not included.   The patient has no history of syncope or presyncope.  There is some  lightheadedness that is associated with changes in position.   His cardiac risk factors are notable for hypertension, diabetes, family  history, and dyslipidemia with an HDL in the 30 range and HDL is quite  low on Lipitor.   He is otherwise quite fit.  He can walk 2-1/4 miles in about 30 minutes  which he does with his wife 5-7 days a week without symptoms.   Followup in the hospital, he underwent echocardiography that was read by  Dr. Charlton Haws demonstrating normal left ventricular function, mild  calcification of the noncoronary cusp.  Review of the records also  demonstrated an episode where he became quite hypotensive with his blood  pressure going from 138 to 68, apparently concurrent with the  application of nitroglycerin.  He improved with fluids.   PAST MEDICAL HISTORY:  In addition to the above is notable for:  1. Remote history of anemia.  2. A tolerance for penicillin.   PAST  SURGICAL HISTORY:  Appendectomy and tonsillectomy.   SOCIAL HISTORY:  He is married.  He has 2 children and 2 grandchildren.  He does not use cigarettes or recreational drugs.  He does drink alcohol  once a week or so.  He works in Airline pilot for [employer].   MEDICATIONS:  1. Potassium 20.  2. Lipitor 10.  3. Verapamil 240.  4. Chlorthalidone 25.  5. Cozaar 100.  6. Metformin 2000.  7. Aspirin.   PHYSICAL EXAMINATION:  VITAL SIGNS:  He is an older Caucasian male.  His  stated age is 54.  His weight was 64 pounds.  His blood pressure is  139/95.  His pulse is 91.  I should note that his weight is down 30  pounds over the last 64 years.  He was not orthostatic.  HEENT:  No scleral icterus.  No xanthelasma.  NECK:  The neck veins were flat.  The carotids were brisk and full  bilaterally without bruits.  BACK:  Without kyphosis, scoliosis.  LUNGS:  Clear.  HEART:  Sounds were regular without murmurs or gallops.  ABDOMEN:  Soft with active bowel sounds.  Without midline pulsation or  hepatomegaly.  EXTREMITIES:  Femoral pulses were 2+ distal.  Pulses were intact, and  there is no cyanosis, clubbing, or edema.  NEUROLOGIC:  Grossly  normal.  The skin was warm and dry.   Electrocardiogram dated today demonstrated sinus rhythm at 91 with  interval 0.13/.09/0.33.  T-wave morphologies were normal.   Review of the electrocardiogram from 27 November demonstrated that there  appears to be a limb lead midposition resulting in a marked shift of the  axis rightward.  I surmised that this is a midposition because the  electrocardiogram 12 hours later demonstrates a normal axis.   In addition, the electrocardiogram of 1700 hours demonstrates ST segment  depression in the inferolateral leads which was gone by the following  morning.   IMPRESSION:  1. Tachy-palpitations question mechanism.  2. Normal left ventricular function.  3. Cardiac risk factors notable for:      a.     Hypertension.      b.      Diabetes.      c.     Family history.      d.     Dyslipidemia.      e.     Metabolic syndrome.  4. Abnormal electrocardiogram with ST segment depression.  5. Obesity.   [name] has multiple cardiac risk factors and not withstanding his  normal left ventricular function, given the ST segment depression on his  electrocardiogram and those risk factors, I think that he should undergo  cardiac catheterization.  This is also important, as he was described as  having premature ventricular contractions and while it is unlikely that  he had fast rapid ventricular tachycardia, ischemic rhythms can present  as an idioventricular rhythm of various rates, and that may be partly  responsible for his symptoms.   Other associations between premature ventricular contractions and tachy-  palpitations would include nonmalignant ventricular arrhythmias that can  occur in the scene of an arteriovenous socially normal heart.  We do not  have access to these strips, and so I am not sure, but it certainly  could be the case.  He does not have evidence of arrhythmogenic right  ventricular dysplasia as evidenced on his electrocardiogram currently.  Hence, any rhythm that he has is unlikely to be potentially life-  threatening, although it was quite anxiety provoking.   To that end, I have recommended that we:  1. Will not undertake stress testing.  I would have a low threshold      for proceeding with catheterization in the event that his stress      test is abnormal.  2. He is to use current medications.  3. Continue his efforts at weight loss and exercise.   Thanks  very much for asking [place] to see him.    Sincerely,     Duke Salvia, MD, [facility]  Electronically Signed   SCK/MedQ  DD: 01/30/2006  DT: 01/30/2006  Job #: [id number]

## 2010-06-18 NOTE — Letter (Signed)
March 04, 2006    Mosetta Putt, M.D.  [address] [place], [place] [address]   RE:  [name], [name]  MRN:  [id number]  /  DOB:  Sep 15, 1945   Dear [name]:   [name] comes in today, his Myoview was normal.  He has not had any  more tachy palpitations.   We discussed the importance of trying to get elucidated the mechanism of  his palpitations if they were to recur, and various options.   He is on top of that.   I have also asked him to ask you whether Hyzaar might be an appropriate  alternative to Cozaar/chlorthalidone.  I should note that his blood  pressure today was better at 135/87.  His pulse was still a little high  at 94, otherwise his exam was unchanged.   I mentioned to him that I would be glad to see him in about 1 year's  time.  If there is anything else I can do in the interim, please do not  hesitate to contact me.    Sincerely,      [name] Salvia, MD, [facility]  Electronically Signed    SCK/MedQ  DD: 03/04/2006  DT: 03/04/2006  Job #: [id number]

## 2010-07-19 ENCOUNTER — Ambulatory Visit (INDEPENDENT_AMBULATORY_CARE_PROVIDER_SITE_OTHER): Payer: BC Managed Care – PPO

## 2010-07-19 ENCOUNTER — Other Ambulatory Visit (INDEPENDENT_AMBULATORY_CARE_PROVIDER_SITE_OTHER): Payer: BC Managed Care – PPO

## 2010-07-19 DIAGNOSIS — I6529 Occlusion and stenosis of unspecified carotid artery: Secondary | ICD-10-CM

## 2010-07-19 DIAGNOSIS — Z48812 Encounter for surgical aftercare following surgery on the circulatory system: Secondary | ICD-10-CM

## 2010-07-26 NOTE — Procedures (Unsigned)
CAROTID DUPLEX EXAM  INDICATION:  Carotid disease.  HISTORY: Diabetes:  Yes. Cardiac:  No. Hypertension:  Yes. Smoking:  Previous. Previous Surgery:  No. CV History:  Currently asymptomatic. Amaurosis Fugax No, Paresthesias No, Hemiparesis No                                      RIGHT             LEFT Brachial systolic pressure:         122               118 Brachial Doppler waveforms:         Normal            Normal Vertebral direction of flow:        Antegrade         Antegrade DUPLEX VELOCITIES (cm/sec) CCA peak systolic                   81                81 ECA peak systolic                   113               89 ICA peak systolic                   84                229 ICA end diastolic                   36                90 PLAQUE MORPHOLOGY:                  Mixed             Mixed PLAQUE AMOUNT:                      Mild              Moderate / severe PLAQUE LOCATION:                    ICA / bifurcation ICA / distal CCA  IMPRESSION: 1. 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 2. No hemodynamically significant stenosis of the right internal     carotid artery with plaque formations as described above. 3. No significant change noted when compared to the previous exam on     01/10/2010.  ___________________________________________ Quita Skye. Hart Rochester, M.D.  CH/MEDQ  D:  07/19/2010  T:  07/19/2010  Job:  [id number]

## 2010-08-06 NOTE — H&P (Signed)
HISTORY AND PHYSICAL EXAMINATION  July 19, 2010  Re:  JOHNPATRICK, JENNY              DOB:  Mar 19, 1945  DIAGNOSIS:  Extracranial carotid occlusive disease.  HISTORY OF PRESENT ILLNESS:  This is a 65 year old white male who is being followed chronically with carotid duplex scans for his carotid occlusive disease.  He presents today for his 85-month follow-up scan. He denies any symptoms of amaurosis fugax, hemiparesis.  He also denies any slurred speech or vision changes.  PAST MEDICAL/PAST SURGICAL HISTORY.: 1. Carotid occlusive disease. 2. Diabetes. 3. Hypertension. 4. Hyperlipidemia. 5. History of prostate cancer     a.     Status post prostatectomy March 09, 2008. 6. Appendectomy. 7. Tonsillectomy.  ALLERGIES:  He states he has an intolerance to penicillin.  MEDICATIONS: 1. Verapamil 240 mg p.o. b.i.d. 2. Metformin 1000 mg b.i.d. 3. Potassium 10 mEq p.o. b.i.d. 4. Glipizide 2.5 mg p.o. b.i.d. 5. Losartan 100 mg p.o. daily. 6. Atorvastatin 10 mg p.o. daily. 7. Aspirin 81 mg p.o. b.i.d. 8. Multivitamin p.o. daily. 9. Chlorthalidone 25 mg p.o. daily. 10.Januvia has been added to his regimen and he will be coming off the     glipizide in the next month or two.  FAMILY HISTORY:  His mother and father both had coronary artery disease. He has a brother with a history of CABG as well as a brother with history of CVA.  His maternal and paternal grandfathers both had CVAs. Father had diabetes.  SOCIAL HISTORY:  Remote for tobacco use.  He denies EtOH on a regular basis.  REVIEW OF SYSTEMS:  GENERAL:  He denies any fever, chills or weight changes. HEENT:  Denies any glaucoma or cataracts.  No changes in eyesight. NEUROLOGIC:  He denies any dizziness, blackouts, headaches or seizures. LUNGS:  He denies any history of hemoptysis or shortness of breath.  Had pneumonia at age 64 months. MUSCULOSKELETAL:  He denies any arthritis or joint pain. VASCULAR:   He denies amaurosis fugax.  Denies slurred speech.  No temporary blindness. CARDIAC:  He denies any chest pain or history of MI. HEMATOLOGIC:  He denies any clotting disorders. GI:  He denies any history of peptic ulcer disease or melena or GERD. URINARY:  He denies any dysuria or hematuria or nocturia.  PHYSICAL EXAM:  Vital Signs:  His blood pressure is 145/75, heart rate is 85, respirations 14, he is 99% on room air.  General:  He is in no acute distress.  His head is normocephalic.  EENT:  Pupils are equal, round, reactive to light.  Conjunctivae are normal.  Lungs:  Clear to auscultation bilaterally.  No rales, rhonchi or wheezing.  Heart:  A regular rate and rhythm.  No murmurs are heard.  Neuro:  There are no focal defects.  NECK:  No carotid bruits were heard.  Abdomen:  Soft, nontender, nondistended.  Musculoskeletal:  There are no major deformities or cyanosis.  Skin:  There are no ulcers or rashes. Extremities:  There is no edema and 2+ dorsalis pedal pulses bilaterally and also 2+ radial pulses bilaterally.  ASSESSMENT:  This is a 65 year old white male with carotid occlusive disease.  PLAN:  To follow up in 6 months with a carotid duplex scan.  He knows to call if he has any problems before then.  Newton Pigg, [place]  Fransisco Hertz, MD Electronically Signed  SE/MEDQ  D:  07/19/2010  T:  07/19/2010  Job:  [id number]

## 2010-12-17 ENCOUNTER — Ambulatory Visit
Admission: RE | Admit: 2010-12-17 | Discharge: 2010-12-17 | Disposition: A | Discharge status: Home / Self Care | Payer: BC Managed Care – PPO | Source: Ambulatory Visit | Attending: Family Medicine | Admitting: Family Medicine

## 2010-12-17 ENCOUNTER — Other Ambulatory Visit: Payer: Self-pay | Admitting: Family Medicine

## 2010-12-17 DIAGNOSIS — M25532 Pain in left wrist: Secondary | ICD-10-CM

## 2011-01-13 ENCOUNTER — Other Ambulatory Visit: Payer: BC Managed Care – PPO

## 2011-01-15 ENCOUNTER — Ambulatory Visit (INDEPENDENT_AMBULATORY_CARE_PROVIDER_SITE_OTHER): Payer: BC Managed Care – PPO | Admitting: *Deleted

## 2011-01-15 DIAGNOSIS — I6529 Occlusion and stenosis of unspecified carotid artery: Secondary | ICD-10-CM

## 2011-01-22 NOTE — Procedures (Unsigned)
CAROTID DUPLEX EXAM  INDICATION:  Follow up carotid disease.  HISTORY: Diabetes:  Yes. Cardiac:  No. Hypertension:  Yes. Smoking:  Previous. Previous Surgery:  No. CV History: Amaurosis Fugax No, Paresthesias No, Hemiparesis No.                                      RIGHT             LEFT Brachial systolic pressure:         124               130 Brachial Doppler waveforms:         WNL               WNL Vertebral direction of flow:        Antegrade         Antegrade DUPLEX VELOCITIES (cm/sec) CCA peak systolic                   71                72 ECA peak systolic                   82                75 ICA peak systolic                   75                305 ICA end diastolic                   29                124 PLAQUE MORPHOLOGY:                  Heterogenous      Heterogenous PLAQUE AMOUNT:                      Mild              Moderate-to-severe PLAQUE LOCATION:                    CCA, ICA          CCA, ICA  IMPRESSION: 1. 1% to 39% right internal carotid artery stenosis. 2. 80% to 99% left internal carotid artery stenosis.  Plaque is     irregular and approximately 2.9 cm in length with an apparently     normal internal carotid artery distal to the lesion. 3. Bilateral vertebral arteries are within normal limits. Dr. Edilia Bo was notified in the clinic.  The patient was instructed to schedule an office visit with Dr. Hart Rochester.  ___________________________________________ Quita Skye. Hart Rochester, M.D.  LT/MEDQ  D:  01/15/2011  T:  01/15/2011  Job:  [id number]

## 2011-01-30 ENCOUNTER — Encounter: Payer: Self-pay | Admitting: Vascular Surgery

## 2011-02-03 ENCOUNTER — Encounter: Payer: Self-pay | Admitting: Vascular Surgery

## 2011-02-04 ENCOUNTER — Ambulatory Visit (INDEPENDENT_AMBULATORY_CARE_PROVIDER_SITE_OTHER): Payer: Medicare Other | Admitting: Vascular Surgery

## 2011-02-04 ENCOUNTER — Encounter: Payer: Self-pay | Admitting: Vascular Surgery

## 2011-02-04 VITALS — BP 132/79 | HR 82 | Resp 18 | Ht 71.0 in | Wt 209.0 lb

## 2011-02-04 DIAGNOSIS — I6529 Occlusion and stenosis of unspecified carotid artery: Secondary | ICD-10-CM

## 2011-02-04 NOTE — Progress Notes (Signed)
Subjective:     Patient ID: [name], male   DOB: [birth year], 66 y.o.   MRN: [id number]  HPI this 66 year old healthy male returns for further followup regarding his carotid occlusive disease. He remains asymptomatic. He has no history of stroke or TIAs. He has no history of coronary artery disease and denies any chest pain or cardiac symptoms. We have been following his carotid disease for the past 3 years and it is now progressed to 80% in severity on the left side.  Past Medical History  Diagnosis Date  . Carotid artery occlusion   . Diabetes mellitus   . Hypertension   . Hyperlipidemia   . Cancer     prostate    History  Substance Use Topics  . Smoking status: Former Smoker    Quit date: 01/29/1986  . Smokeless tobacco: Never Used  . Alcohol Use: No    Family History  Problem Relation Age of Onset  . Heart disease Mother   . Heart disease Father 78    Myocardial Infarction  . Diabetes Father   . Heart disease Brother     CABG history  . Stroke Brother   . Stroke Maternal Grandfather   . Stroke Paternal Grandfather     Allergies  Allergen Reactions  . Penicillins     Current outpatient prescriptions:aspirin 81 MG tablet, Take 81 mg by mouth daily.  , Disp: , Rfl: ;  atorvastatin (LIPITOR) 10 MG tablet, Take 10 mg by mouth daily.  , Disp: , Rfl: ;  chlorthalidone (HYGROTON) 25 MG tablet, Take 25 mg by mouth daily.  , Disp: , Rfl: ;  losartan (COZAAR) 100 MG tablet, Take 100 mg by mouth daily.  , Disp: , Rfl: ;  metFORMIN (GLUCOPHAGE) 500 MG tablet, Take 1,000 mg by mouth 2 (two) times daily. , Disp: , Rfl:  Multiple Vitamin (MULTIVITAMIN) tablet, Take 1 tablet by mouth daily.  , Disp: , Rfl: ;  Naproxen Sodium (ALEVE) 220 MG CAPS, Take by mouth daily as needed.  , Disp: , Rfl: ;  potassium chloride (K-DUR) 10 MEQ tablet, Take 10 mEq by mouth 2 (two) times daily.  , Disp: , Rfl: ;  sitaGLIPtin (JANUVIA) 100 MG tablet, Take 100 mg by mouth daily.  , Disp: , Rfl:    verapamil (COVERA HS) 240 MG (CO) 24 hr tablet, Take 240 mg by mouth at bedtime.  , Disp: , Rfl: ;  glipiZIDE (GLUCOTROL XL) 2.5 MG 24 hr tablet, Take 2.5 mg by mouth 2 (two) times daily.  , Disp: , Rfl:   BP 132/79  Pulse 82  Resp 18  Ht 5\' 11"  (1.803 m)  Wt 209 lb (94.802 kg)  BMI 29.15 kg/m2  Body mass index is 29.15 kg/(m^2).        Review of Systems denies chest pain, dyspnea on exertion, PND, orthopnea, claudication, syncope, diplopia, blurred vision, amaurosis fugax. Denies all other symptoms    Objective:   Physical Exam The pressure 132/79 heart rate 80 respirations 18 General well-developed well-nourished male no apparent distress alert and oriented x3 HEENT normal for age Lungs no rhonchi or wheezing Cardiovascular regular rhythm no murmurs carotid pulses 3+ soft bruit on the left no bruit on the right Abdomen soft nontender with no masses Neurologic normal Skin free of rashes Muscle skeletal free major deformities Lower extremity 3+ femoral and posterior tibial pulses palpable  Today I reviewed a carotid duplex done in our office on 01/15/2011 which reveals that  the left internal carotid stenosis has progressed to 80% in severity    Assessment:     Severe left internal carotid stenosis-asymptomatic    Plan:     Left carotid endarterectomy scheduled for Wednesday, January 18 at [facility]. Risks and benefits of been thoroughly discussed with patient and he would like to proceed

## 2011-02-06 ENCOUNTER — Other Ambulatory Visit: Payer: Self-pay

## 2011-02-06 ENCOUNTER — Encounter (HOSPITAL_COMMUNITY): Payer: Self-pay | Admitting: Pharmacy Technician

## 2011-02-07 ENCOUNTER — Other Ambulatory Visit: Payer: Self-pay

## 2011-02-07 ENCOUNTER — Encounter (HOSPITAL_COMMUNITY)
Admission: RE | Admit: 2011-02-07 | Discharge: 2011-02-07 | Disposition: A | Discharge status: Home / Self Care | Payer: Medicare Other | Source: Ambulatory Visit | Attending: Anesthesiology | Admitting: Anesthesiology

## 2011-02-07 ENCOUNTER — Encounter (HOSPITAL_COMMUNITY): Payer: Self-pay

## 2011-02-07 ENCOUNTER — Encounter (HOSPITAL_COMMUNITY)
Admission: RE | Admit: 2011-02-07 | Discharge: 2011-02-07 | Disposition: A | Discharge status: Home / Self Care | Payer: Medicare Other | Source: Ambulatory Visit | Attending: Vascular Surgery | Admitting: Vascular Surgery

## 2011-02-07 HISTORY — DX: Pain in unspecified joint: M25.50

## 2011-02-07 HISTORY — DX: Calculus of kidney: N20.0

## 2011-02-07 HISTORY — DX: Adverse effect of unspecified anesthetic, initial encounter: T41.45XA

## 2011-02-07 HISTORY — DX: Pneumonia, unspecified organism: J18.9

## 2011-02-07 HISTORY — DX: Inflammatory liver disease, unspecified: K75.9

## 2011-02-07 HISTORY — DX: Other complications of anesthesia, initial encounter: T88.59XA

## 2011-02-07 HISTORY — DX: Reserved for concepts with insufficient information to code with codable children: IMO0002

## 2011-02-07 LAB — COMPREHENSIVE METABOLIC PANEL
ALT: 43 U/L (ref 0–53)
AST: 27 U/L (ref 0–37)
Albumin: 4.3 g/dL (ref 3.5–5.2)
Alkaline Phosphatase: 55 U/L (ref 39–117)
Glucose, Bld: 112 mg/dL — ABNORMAL HIGH (ref 70–99)
Potassium: 3.7 mEq/L (ref 3.5–5.1)
Sodium: 139 mEq/L (ref 135–145)
Total Protein: 7.5 g/dL (ref 6.0–8.3)

## 2011-02-07 LAB — URINALYSIS, ROUTINE W REFLEX MICROSCOPIC
Glucose, UA: NEGATIVE mg/dL
Leukocytes, UA: NEGATIVE
Nitrite: NEGATIVE
Protein, ur: NEGATIVE mg/dL
pH: 7.5 (ref 5.0–8.0)

## 2011-02-07 LAB — ABO/RH: ABO/RH(D): O POS

## 2011-02-07 LAB — PREPARE RBC (CROSSMATCH)

## 2011-02-07 LAB — TYPE AND SCREEN: Unit division: 0

## 2011-02-07 LAB — DIFFERENTIAL
Eosinophils Absolute: 0.2 10*3/uL (ref 0.0–0.7)
Eosinophils Relative: 2 % (ref 0–5)
Lymphs Abs: 2.3 10*3/uL (ref 0.7–4.0)
Monocytes Absolute: 0.6 10*3/uL (ref 0.1–1.0)
Monocytes Relative: 8 % (ref 3–12)

## 2011-02-07 LAB — SURGICAL PCR SCREEN
MRSA, PCR: NEGATIVE
Staphylococcus aureus: POSITIVE — AB

## 2011-02-07 LAB — CBC
Hemoglobin: 16.5 g/dL (ref 13.0–17.0)
MCHC: 35.9 g/dL (ref 30.0–36.0)
Platelets: 162 10*3/uL (ref 150–400)
RDW: 12.8 % (ref 11.5–15.5)

## 2011-02-07 NOTE — Progress Notes (Signed)
Dr.Steven Graciela Husbands is cardiologist and last appointment was 01/2010;sees him once a yr   Echo in epic Stress test not in epic-to request from Dr.Klein Carotid Doppler in epic

## 2011-02-07 NOTE — Pre-Procedure Instructions (Signed)
20 KAICEN DESENA  02/07/2011   Your procedure is scheduled on:  Wed,Jan 16 @ 0830  Report to [facility] at 0630 AM.  Call this number if you have problems the morning of surgery: [phone]   Remember:   Do not eat food:After Midnight.  May have clear liquids: up to 4 Hours before arrival.(until 2:30am)  Clear liquids include soda, tea, black coffee, apple or grape juice, broth.  Take these medicines the morning of surgery with A SIP OF WATER: Chlorthalidone   Do not wear jewelry, make-up or nail polish.  Do not wear lotions, powders, or perfumes. You may wear deodorant.  Do not shave 48 hours prior to surgery.  Do not bring valuables to the hospital.  Contacts, dentures or bridgework may not be worn into surgery.  Leave suitcase in the car. After surgery it may be brought to your room.  For patients admitted to the hospital, checkout time is 11:00 AM the day of discharge.   Patients discharged the day of surgery will not be allowed to drive home.  Name and phone number of your driver:   Special Instructions: CHG Shower Use Special Wash: 1/2 bottle night before surgery and 1/2 bottle morning of surgery.   Please read over the following fact sheets that you were given: Pain Booklet, Coughing and Deep Breathing, Blood Transfusion Information, MRSA Information and Surgical Site Infection Prevention

## 2011-02-11 MED ORDER — VANCOMYCIN HCL IN DEXTROSE 1-5 GM/200ML-% IV SOLN
1000.0000 mg | INTRAVENOUS | Status: AC
  Administered 2011-02-12: 1000 mg via INTRAVENOUS
  Filled 2011-02-11: qty 200

## 2011-02-12 ENCOUNTER — Encounter (HOSPITAL_COMMUNITY)
Admission: RE | Disposition: A | Discharge status: Home / Self Care | Payer: Self-pay | Source: Ambulatory Visit | Attending: Vascular Surgery

## 2011-02-12 ENCOUNTER — Encounter (HOSPITAL_COMMUNITY): Payer: Self-pay | Admitting: Anesthesiology

## 2011-02-12 ENCOUNTER — Other Ambulatory Visit: Payer: Self-pay | Admitting: Vascular Surgery

## 2011-02-12 ENCOUNTER — Inpatient Hospital Stay (HOSPITAL_COMMUNITY)
Admission: RE | Admit: 2011-02-12 | Discharge: 2011-02-13 | DRG: 039 | Disposition: A | Discharge status: Home / Self Care | Payer: Medicare Other | Source: Ambulatory Visit | Attending: Vascular Surgery | Admitting: Vascular Surgery

## 2011-02-12 ENCOUNTER — Ambulatory Visit (HOSPITAL_COMMUNITY): Payer: Medicare Other | Admitting: Anesthesiology

## 2011-02-12 ENCOUNTER — Encounter (HOSPITAL_COMMUNITY): Payer: Self-pay | Admitting: *Deleted

## 2011-02-12 DIAGNOSIS — Z823 Family history of stroke: Secondary | ICD-10-CM

## 2011-02-12 DIAGNOSIS — E785 Hyperlipidemia, unspecified: Secondary | ICD-10-CM | POA: Diagnosis present

## 2011-02-12 DIAGNOSIS — Z833 Family history of diabetes mellitus: Secondary | ICD-10-CM

## 2011-02-12 DIAGNOSIS — I6529 Occlusion and stenosis of unspecified carotid artery: Secondary | ICD-10-CM

## 2011-02-12 DIAGNOSIS — E119 Type 2 diabetes mellitus without complications: Secondary | ICD-10-CM | POA: Diagnosis present

## 2011-02-12 DIAGNOSIS — Z01818 Encounter for other preprocedural examination: Secondary | ICD-10-CM

## 2011-02-12 DIAGNOSIS — Z8546 Personal history of malignant neoplasm of prostate: Secondary | ICD-10-CM

## 2011-02-12 DIAGNOSIS — R29810 Facial weakness: Secondary | ICD-10-CM | POA: Diagnosis not present

## 2011-02-12 DIAGNOSIS — Z01812 Encounter for preprocedural laboratory examination: Secondary | ICD-10-CM

## 2011-02-12 DIAGNOSIS — Z01810 Encounter for preprocedural cardiovascular examination: Secondary | ICD-10-CM

## 2011-02-12 DIAGNOSIS — Z87891 Personal history of nicotine dependence: Secondary | ICD-10-CM

## 2011-02-12 HISTORY — PX: CAROTID ENDARTERECTOMY: SUR193

## 2011-02-12 HISTORY — PX: ENDARTERECTOMY: SHX5162

## 2011-02-12 LAB — GLUCOSE, CAPILLARY: Glucose-Capillary: 204 mg/dL — ABNORMAL HIGH (ref 70–99)

## 2011-02-12 SURGERY — ENDARTERECTOMY, CAROTID
Anesthesia: General | Site: Neck | Laterality: Left | Wound class: Clean

## 2011-02-12 MED ORDER — HEPARIN SODIUM (PORCINE) 1000 UNIT/ML IJ SOLN
INTRAMUSCULAR | Status: DC | PRN
  Administered 2011-02-12: 7000 [IU] via INTRAVENOUS

## 2011-02-12 MED ORDER — GLYCOPYRROLATE 0.2 MG/ML IJ SOLN
INTRAMUSCULAR | Status: DC | PRN
  Administered 2011-02-12: .6 mg via INTRAVENOUS

## 2011-02-12 MED ORDER — ACETAMINOPHEN 325 MG PO TABS
325.0000 mg | ORAL_TABLET | ORAL | Status: DC | PRN
  Administered 2011-02-12 – 2011-02-13 (×4): 650 mg via ORAL
  Filled 2011-02-12 (×4): qty 2

## 2011-02-12 MED ORDER — PROTAMINE SULFATE 10 MG/ML IV SOLN
INTRAVENOUS | Status: DC | PRN
  Administered 2011-02-12: 10 mg via INTRAVENOUS
  Administered 2011-02-12: 40 mg via INTRAVENOUS

## 2011-02-12 MED ORDER — VERAPAMIL HCL 240 MG (CO) PO TB24: 240.0000 mg | ORAL_TABLET | Freq: Two times a day (BID) | ORAL | Status: DC

## 2011-02-12 MED ORDER — MORPHINE SULFATE 2 MG/ML IJ SOLN
2.0000 mg | INTRAMUSCULAR | Status: DC | PRN
  Administered 2011-02-12 (×5): 2 mg via INTRAVENOUS
  Filled 2011-02-12 (×2): qty 1
  Filled 2011-02-12: qty 2
  Filled 2011-02-12: qty 1

## 2011-02-12 MED ORDER — SODIUM CHLORIDE 0.9 % IR SOLN
Status: DC | PRN
  Administered 2011-02-12: 09:00:00

## 2011-02-12 MED ORDER — ONDANSETRON HCL 4 MG/2ML IJ SOLN
4.0000 mg | Freq: Once | INTRAMUSCULAR | Status: AC | PRN
  Administered 2011-02-12: 4 mg via INTRAVENOUS

## 2011-02-12 MED ORDER — SENNOSIDES-DOCUSATE SODIUM 8.6-50 MG PO TABS
1.0000 | ORAL_TABLET | Freq: Every evening | ORAL | Status: DC | PRN
  Filled 2011-02-12: qty 1

## 2011-02-12 MED ORDER — DOCUSATE SODIUM 100 MG PO CAPS: 100.0000 mg | ORAL_CAPSULE | Freq: Every day | ORAL | Status: DC

## 2011-02-12 MED ORDER — CHLORTHALIDONE 25 MG PO TABS
25.0000 mg | ORAL_TABLET | Freq: Every day | ORAL | Status: DC
  Administered 2011-02-12: 25 mg via ORAL
  Filled 2011-02-12 (×2): qty 1

## 2011-02-12 MED ORDER — LACTATED RINGERS IV SOLN
INTRAVENOUS | Status: DC | PRN
  Administered 2011-02-12 (×2): via INTRAVENOUS

## 2011-02-12 MED ORDER — SODIUM CHLORIDE 0.9 % IV SOLN: 500.0000 mL | Freq: Once | INTRAVENOUS | Status: AC | PRN

## 2011-02-12 MED ORDER — VECURONIUM BROMIDE 10 MG IV SOLR
INTRAVENOUS | Status: DC | PRN
  Administered 2011-02-12: 2 mg via INTRAVENOUS

## 2011-02-12 MED ORDER — ROCURONIUM BROMIDE 100 MG/10ML IV SOLN
INTRAVENOUS | Status: DC | PRN
  Administered 2011-02-12: 50 mg via INTRAVENOUS

## 2011-02-12 MED ORDER — ONDANSETRON HCL 4 MG/2ML IJ SOLN: 4.0000 mg | Freq: Four times a day (QID) | INTRAMUSCULAR | Status: DC | PRN

## 2011-02-12 MED ORDER — PROPOFOL 10 MG/ML IV EMUL
INTRAVENOUS | Status: DC | PRN
  Administered 2011-02-12: 200 mg via INTRAVENOUS

## 2011-02-12 MED ORDER — ASPIRIN EC 325 MG PO TBEC: 325.0000 mg | DELAYED_RELEASE_TABLET | Freq: Every day | ORAL | Status: DC

## 2011-02-12 MED ORDER — LABETALOL HCL 5 MG/ML IV SOLN
INTRAVENOUS | Status: DC | PRN
  Administered 2011-02-12 (×2): 5 mg via INTRAVENOUS
  Administered 2011-02-12: 2.5 mg via INTRAVENOUS
  Administered 2011-02-12: 5 mg via INTRAVENOUS
  Administered 2011-02-12: 2.5 mg via INTRAVENOUS

## 2011-02-12 MED ORDER — DOPAMINE-DEXTROSE 3.2-5 MG/ML-% IV SOLN: 3.0000 ug/kg/min | INTRAVENOUS | Status: DC

## 2011-02-12 MED ORDER — VERAPAMIL HCL ER 240 MG PO TBCR
240.0000 mg | EXTENDED_RELEASE_TABLET | Freq: Every day | ORAL | Status: DC
  Administered 2011-02-12: 240 mg via ORAL
  Filled 2011-02-12 (×2): qty 1

## 2011-02-12 MED ORDER — MORPHINE SULFATE 2 MG/ML IJ SOLN
INTRAMUSCULAR | Status: AC
  Administered 2011-02-12: 2 mg via INTRAVENOUS
  Filled 2011-02-12: qty 1

## 2011-02-12 MED ORDER — SIMVASTATIN 20 MG PO TABS
20.0000 mg | ORAL_TABLET | Freq: Every day | ORAL | Status: DC
  Filled 2011-02-12 (×2): qty 1

## 2011-02-12 MED ORDER — SODIUM CHLORIDE 0.9 % IR SOLN
Status: DC | PRN
  Administered 2011-02-12: 1000 mL

## 2011-02-12 MED ORDER — VANCOMYCIN HCL IN DEXTROSE 1-5 GM/200ML-% IV SOLN
1000.0000 mg | Freq: Two times a day (BID) | INTRAVENOUS | Status: DC
  Filled 2011-02-12 (×2): qty 200

## 2011-02-12 MED ORDER — INSULIN ASPART 100 UNIT/ML ~~LOC~~ SOLN
0.0000 [IU] | Freq: Three times a day (TID) | SUBCUTANEOUS | Status: DC
  Administered 2011-02-12: 3 [IU] via SUBCUTANEOUS
  Administered 2011-02-13: 5 [IU] via SUBCUTANEOUS
  Filled 2011-02-12: qty 3

## 2011-02-12 MED ORDER — GUAIFENESIN-DM 100-10 MG/5ML PO SYRP: 15.0000 mL | ORAL_SOLUTION | ORAL | Status: DC | PRN

## 2011-02-12 MED ORDER — POTASSIUM CHLORIDE ER 10 MEQ PO TBCR
10.0000 meq | EXTENDED_RELEASE_TABLET | Freq: Two times a day (BID) | ORAL | Status: DC
  Administered 2011-02-12: 10 meq via ORAL
  Filled 2011-02-12 (×3): qty 1

## 2011-02-12 MED ORDER — SUFENTANIL CITRATE 50 MCG/ML IV SOLN
INTRAVENOUS | Status: DC | PRN
  Administered 2011-02-12: 5 ug via INTRAVENOUS
  Administered 2011-02-12: 15 ug via INTRAVENOUS
  Administered 2011-02-12 (×2): 5 ug via INTRAVENOUS

## 2011-02-12 MED ORDER — LOSARTAN POTASSIUM 50 MG PO TABS
100.0000 mg | ORAL_TABLET | Freq: Every day | ORAL | Status: DC
  Administered 2011-02-12: 100 mg via ORAL
  Filled 2011-02-12 (×2): qty 2

## 2011-02-12 MED ORDER — LINAGLIPTIN 5 MG PO TABS
5.0000 mg | ORAL_TABLET | Freq: Every day | ORAL | Status: DC
  Filled 2011-02-12 (×2): qty 1

## 2011-02-12 MED ORDER — METOPROLOL TARTRATE 1 MG/ML IV SOLN: 2.0000 mg | INTRAVENOUS | Status: DC | PRN

## 2011-02-12 MED ORDER — ACETAMINOPHEN 650 MG RE SUPP: 325.0000 mg | RECTAL | Status: DC | PRN

## 2011-02-12 MED ORDER — NEOSTIGMINE METHYLSULFATE 1 MG/ML IJ SOLN
INTRAMUSCULAR | Status: DC | PRN
  Administered 2011-02-12: 5 mg via INTRAVENOUS

## 2011-02-12 MED ORDER — POTASSIUM CHLORIDE CRYS ER 20 MEQ PO TBCR: 20.0000 meq | EXTENDED_RELEASE_TABLET | Freq: Once | ORAL | Status: AC | PRN

## 2011-02-12 MED ORDER — METFORMIN HCL 500 MG PO TABS
1000.0000 mg | ORAL_TABLET | Freq: Two times a day (BID) | ORAL | Status: DC
  Administered 2011-02-13: 1000 mg via ORAL
  Filled 2011-02-12 (×5): qty 2

## 2011-02-12 MED ORDER — HYDRALAZINE HCL 20 MG/ML IJ SOLN
10.0000 mg | INTRAMUSCULAR | Status: DC | PRN
  Filled 2011-02-12: qty 0.5

## 2011-02-12 MED ORDER — OXYCODONE-ACETAMINOPHEN 5-325 MG PO TABS: 1.0000 | ORAL_TABLET | ORAL | Status: DC | PRN

## 2011-02-12 MED ORDER — LIDOCAINE HCL (CARDIAC) 20 MG/ML IV SOLN
INTRAVENOUS | Status: DC | PRN
  Administered 2011-02-12: 70 mg via INTRAVENOUS

## 2011-02-12 MED ORDER — PHENYLEPHRINE HCL 10 MG/ML IJ SOLN
10.0000 mg | INTRAVENOUS | Status: DC | PRN
  Administered 2011-02-12: 10 ug/min via INTRAVENOUS

## 2011-02-12 MED ORDER — ONDANSETRON HCL 4 MG/2ML IJ SOLN
INTRAMUSCULAR | Status: DC | PRN
  Administered 2011-02-12: 4 mg via INTRAVENOUS

## 2011-02-12 MED ORDER — LABETALOL HCL 5 MG/ML IV SOLN: 10.0000 mg | INTRAVENOUS | Status: DC | PRN

## 2011-02-12 MED ORDER — VANCOMYCIN HCL IN DEXTROSE 1-5 GM/200ML-% IV SOLN
1000.0000 mg | Freq: Two times a day (BID) | INTRAVENOUS | Status: AC
  Administered 2011-02-12 – 2011-02-13 (×2): 1000 mg via INTRAVENOUS
  Filled 2011-02-12 (×2): qty 200

## 2011-02-12 MED ORDER — FAMOTIDINE IN NACL 20-0.9 MG/50ML-% IV SOLN
20.0000 mg | Freq: Two times a day (BID) | INTRAVENOUS | Status: DC
  Administered 2011-02-12: 20 mg via INTRAVENOUS
  Filled 2011-02-12 (×3): qty 50

## 2011-02-12 MED ORDER — HYDROMORPHONE HCL PF 1 MG/ML IJ SOLN
0.2500 mg | INTRAMUSCULAR | Status: DC | PRN
  Administered 2011-02-12 (×2): 0.5 mg via INTRAVENOUS

## 2011-02-12 MED ORDER — POTASSIUM CHLORIDE IN NACL 20-0.9 MEQ/L-% IV SOLN
INTRAVENOUS | Status: DC
  Administered 2011-02-12 – 2011-02-13 (×2): via INTRAVENOUS
  Filled 2011-02-12 (×4): qty 1000

## 2011-02-12 MED ORDER — PHENOL 1.4 % MT LIQD: 1.0000 | OROMUCOSAL | Status: DC | PRN

## 2011-02-12 SURGICAL SUPPLY — 46 items
CANISTER SUCTION 2500CC (MISCELLANEOUS) ×2 IMPLANT
CATH ROBINSON RED A/P 18FR (CATHETERS) ×2 IMPLANT
CATH SUCT 10FR WHISTLE TIP (CATHETERS) ×2 IMPLANT
CLIP TI MEDIUM 24 (CLIP) ×2 IMPLANT
CLIP TI WIDE RED SMALL 24 (CLIP) ×2 IMPLANT
CLOTH BEACON ORANGE TIMEOUT ST (SAFETY) ×2 IMPLANT
COVER SURGICAL LIGHT HANDLE (MISCELLANEOUS) ×4 IMPLANT
CRADLE DONUT ADULT HEAD (MISCELLANEOUS) ×2 IMPLANT
DECANTER SPIKE VIAL GLASS SM (MISCELLANEOUS) IMPLANT
DRAIN HEMOVAC 1/8 X 5 (WOUND CARE) IMPLANT
DRAPE WARM FLUID 44X44 (DRAPE) ×2 IMPLANT
DRSG COVADERM 4X6 (GAUZE/BANDAGES/DRESSINGS) ×1 IMPLANT
ELECT REM PT RETURN 9FT ADLT (ELECTROSURGICAL) ×2
ELECTRODE REM PT RTRN 9FT ADLT (ELECTROSURGICAL) ×1 IMPLANT
EVACUATOR SILICONE 100CC (DRAIN) IMPLANT
GLOVE BIO SURGEON STRL SZ 6.5 (GLOVE) ×1 IMPLANT
GLOVE BIOGEL PI IND STRL 6.5 (GLOVE) IMPLANT
GLOVE BIOGEL PI IND STRL 7.0 (GLOVE) IMPLANT
GLOVE BIOGEL PI IND STRL 7.5 (GLOVE) IMPLANT
GLOVE BIOGEL PI INDICATOR 6.5 (GLOVE) ×1
GLOVE BIOGEL PI INDICATOR 7.0 (GLOVE) ×1
GLOVE BIOGEL PI INDICATOR 7.5 (GLOVE) ×1
GLOVE SS BIOGEL STRL SZ 7 (GLOVE) ×1 IMPLANT
GLOVE SUPERSENSE BIOGEL SZ 7 (GLOVE) ×1
GLOVE SURG SS PI 7.5 STRL IVOR (GLOVE) ×1 IMPLANT
GOWN STRL NON-REIN LRG LVL3 (GOWN DISPOSABLE) ×4 IMPLANT
INSERT FOGARTY SM (MISCELLANEOUS) ×2 IMPLANT
KIT BASIN OR (CUSTOM PROCEDURE TRAY) ×2 IMPLANT
KIT ROOM TURNOVER OR (KITS) ×2 IMPLANT
NEEDLE 22X1 1/2 (OR ONLY) (NEEDLE) IMPLANT
NS IRRIG 1000ML POUR BTL (IV SOLUTION) ×4 IMPLANT
PACK CAROTID (CUSTOM PROCEDURE TRAY) ×2 IMPLANT
PAD ARMBOARD 7.5X6 YLW CONV (MISCELLANEOUS) ×4 IMPLANT
PATCH HEMASHIELD 8X75 (Vascular Products) ×1 IMPLANT
SHUNT CAROTID BYPASS 12FRX15.5 (VASCULAR PRODUCTS) IMPLANT
SPECIMEN JAR SMALL (MISCELLANEOUS) ×2 IMPLANT
SUT PROLENE 6 0 C 1 30 (SUTURE) ×1 IMPLANT
SUT PROLENE 6 0 CC (SUTURE) ×2 IMPLANT
SUT SILK 2 0 FS (SUTURE) ×2 IMPLANT
SUT VIC AB 2-0 CT1 27 (SUTURE) ×2
SUT VIC AB 2-0 CT1 TAPERPNT 27 (SUTURE) ×1 IMPLANT
SUT VIC AB 3-0 X1 27 (SUTURE) ×2 IMPLANT
SYR CONTROL 10ML LL (SYRINGE) IMPLANT
TOWEL OR 17X24 6PK STRL BLUE (TOWEL DISPOSABLE) ×2 IMPLANT
TOWEL OR 17X26 10 PK STRL BLUE (TOWEL DISPOSABLE) ×2 IMPLANT
WATER STERILE IRR 1000ML POUR (IV SOLUTION) ×2 IMPLANT

## 2011-02-12 NOTE — Anesthesia Procedure Notes (Addendum)
Procedure Name: Intubation Date/Time: 02/12/2011 8:50 AM Performed by: Romie Minus Pre-anesthesia Checklist: Patient identified, Timeout performed, Emergency Drugs available, Suction available and Patient being monitored Patient Re-evaluated:Patient Re-evaluated prior to inductionOxygen Delivery Method: Circle System Utilized Preoxygenation: Pre-oxygenation with 100% oxygen Intubation Type: IV induction Ventilation: Mask ventilation without difficulty Laryngoscope Size: Miller and 2 Grade View: Grade I Tube type: Oral Number of attempts: 1 Airway Equipment and Method: stylet,  LTA and bite block Placement Confirmation: ETT inserted through vocal cords under direct vision,  positive ETCO2 and breath sounds checked- equal and bilateral Secured at: 23 cm Tube secured with: Tape Dental Injury: Teeth and Oropharynx as per pre-operative assessment

## 2011-02-12 NOTE — Op Note (Signed)
OPERATIVE REPORT  Date of Surgery: 02/12/2011  Surgeon: Josephina Gip, MD  Assistant: Roger Shelter  Pre-op Diagnosis: Left Internal Carotid Artery Stenosis-asymptomatic  Post-op Diagnosis: Left Internal Carotid Artery Stenosis- asymptomatic  Procedure: Procedure(s): ENDARTERECTOMY CAROTIDleft  Anesthesia: General  EBL: 150 cc  Complications: None  Procedure Details: The patient was taken to the operating room and placed in the supine position. Following induction of satisfactory general endotracheal anesthesia the left neck was prepped and draped in a routine sterile manner. Incision was made on the anterior border of the sternocleidomastoid muscle and carried down through the subcutaneous tissue and platysma using the Bovie. Care was taken not to injure the hypoglossal nerve.. The common internal and external carotid arteries were dissected free. There was a calcified atherosclerotic plaque at the carotid bifurcation extending up the internal carotid artery. A #10 shunt was then prepared and the patient was heparinized. The carotid vessels were occluded with vascular clamps. A longitudinal opening was made in the common carotid with a 15 blade extended up the internal carotid with the Potts scissors to a point distal to the disease. The plaque was approximately 80% stenotic in severitythe plaque was quite long and extending up above the crossing of the hypoglossal nerve. The distal vessel appeared normal. Shunt was inserted without difficulty reestablishing flow in about 2 minutes. A standard endarterectomy was performed with an eversion endarterectomy of the external carotid. The plaque feathered off  the distal internal carotid artery nicely not requiring any tacking sutures. The lumen was thoroughly irrigated with heparinized saline and loose debris all carefully removed. The arterotomy was then closed with a patch using continuous 6-0 Prolene. Prior to completion of the  Closure the   shunt was removed after approximately 45  minutes of shunt time. Flow was then reestablished up the external branch initially followed by the internal branch. Protamine was given to her reverse the heparin.Following adequate hemostasis the wound was irrigated with saline and closed in layers with Vicryl ain a subcuticular fashion. Sterile dressing was applied and the patient taken to the recovery room in stable condition.  Josephina Gip, MD 02/12/2011 10:51 AM

## 2011-02-12 NOTE — Anesthesia Preprocedure Evaluation (Addendum)
Anesthesia Evaluation  Patient identified by MRN, date of birth, ID band Patient awake    Reviewed: Allergy & Precautions, H&P , NPO status , Patient's Chart, lab work & pertinent test results  History of Anesthesia Complications Negative for: history of anesthetic complications  Airway Mallampati: I TM Distance: >3 FB Neck ROM: full    Dental  (+) Teeth Intact   Pulmonary pneumonia  (at 18 mos),          Cardiovascular Exercise Tolerance: Good hypertension, Pt. on medications regular Normal    Neuro/Psych Negative Neurological ROS  Negative Psych ROS   GI/Hepatic negative GI ROS, (+) Hepatitis -, AAs a child; no complications since then.   Endo/Other  Diabetes mellitus-, Well Controlled, Type 2, Oral Hypoglycemic Agents  Renal/GU   Genitourinary negative   Musculoskeletal   Abdominal   Peds  Hematology negative hematology ROS (+)   Anesthesia Other Findings   Reproductive/Obstetrics                          Anesthesia Physical Anesthesia Plan  ASA: III  Anesthesia Plan: General   Post-op Pain Management:    Induction: Intravenous  Airway Management Planned: Oral ETT  Additional Equipment: Arterial line  Intra-op Plan:   Post-operative Plan: Extubation in OR  Informed Consent: I have reviewed the patients History and Physical, chart, labs and discussed the procedure including the risks, benefits and alternatives for the proposed anesthesia with the patient or authorized representative who has indicated his/her understanding and acceptance.     Plan Discussed with: CRNA, Anesthesiologist and Surgeon  Anesthesia Plan Comments:         Anesthesia Quick Evaluation

## 2011-02-12 NOTE — Transfer of Care (Signed)
Immediate Anesthesia Transfer of Care Note  Patient: [name]  Procedure(s) Performed:  ENDARTERECTOMY CAROTID  Patient Location: PACU  Anesthesia Type: General  Level of Consciousness: awake  Airway & Oxygen Therapy: Patient Spontanous Breathing and non-rebreather face mask  Post-op Assessment: Report given to PACU RN and Post -op Vital signs reviewed and stable  Post vital signs: stable Filed Vitals:   02/12/11 0619  BP: 127/69  Pulse: 68  Temp: 36.7 C  Resp: 16    Complications: No apparent anesthesia complications

## 2011-02-12 NOTE — Preoperative (Signed)
Beta Blockers   Reason not to administer Beta Blockers:Not Applicable 

## 2011-02-12 NOTE — H&P (View-Only) (Signed)
Subjective:     Patient ID: [name], male   DOB: 09/19/1945, 65 y.o.   MRN: [id number]  HPI this 65-year-old healthy male returns for further followup regarding his carotid occlusive disease. He remains asymptomatic. He has no history of stroke or TIAs. He has no history of coronary artery disease and denies any chest pain or cardiac symptoms. We have been following his carotid disease for the past 3 years and it is now progressed to 80% in severity on the left side.  Past Medical History  Diagnosis Date  . Carotid artery occlusion   . Diabetes mellitus   . Hypertension   . Hyperlipidemia   . Cancer     prostate    History  Substance Use Topics  . Smoking status: Former Smoker    Quit date: 01/29/1986  . Smokeless tobacco: Never Used  . Alcohol Use: No    Family History  Problem Relation Age of Onset  . Heart disease Mother   . Heart disease Father 52    Myocardial Infarction  . Diabetes Father   . Heart disease Brother     CABG history  . Stroke Brother   . Stroke Maternal Grandfather   . Stroke Paternal Grandfather     Allergies  Allergen Reactions  . Penicillins     Current outpatient prescriptions:aspirin 81 MG tablet, Take 81 mg by mouth daily.  , Disp: , Rfl: ;  atorvastatin (LIPITOR) 10 MG tablet, Take 10 mg by mouth daily.  , Disp: , Rfl: ;  chlorthalidone (HYGROTON) 25 MG tablet, Take 25 mg by mouth daily.  , Disp: , Rfl: ;  losartan (COZAAR) 100 MG tablet, Take 100 mg by mouth daily.  , Disp: , Rfl: ;  metFORMIN (GLUCOPHAGE) 500 MG tablet, Take 1,000 mg by mouth 2 (two) times daily. , Disp: , Rfl:  Multiple Vitamin (MULTIVITAMIN) tablet, Take 1 tablet by mouth daily.  , Disp: , Rfl: ;  Naproxen Sodium (ALEVE) 220 MG CAPS, Take by mouth daily as needed.  , Disp: , Rfl: ;  potassium chloride (K-DUR) 10 MEQ tablet, Take 10 mEq by mouth 2 (two) times daily.  , Disp: , Rfl: ;  sitaGLIPtin (JANUVIA) 100 MG tablet, Take 100 mg by mouth daily.  , Disp: , Rfl:    verapamil (COVERA HS) 240 MG (CO) 24 hr tablet, Take 240 mg by mouth at bedtime.  , Disp: , Rfl: ;  glipiZIDE (GLUCOTROL XL) 2.5 MG 24 hr tablet, Take 2.5 mg by mouth 2 (two) times daily.  , Disp: , Rfl:   BP 132/79  Pulse 82  Resp 18  Ht 5' 11" (1.803 m)  Wt 209 lb (94.802 kg)  BMI 29.15 kg/m2  Body mass index is 29.15 kg/(m^2).        Review of Systems denies chest pain, dyspnea on exertion, PND, orthopnea, claudication, syncope, diplopia, blurred vision, amaurosis fugax. Denies all other symptoms    Objective:   Physical Exam The pressure 132/79 heart rate 80 respirations 18 General well-developed well-nourished male no apparent distress alert and oriented x3 HEENT normal for age Lungs no rhonchi or wheezing Cardiovascular regular rhythm no murmurs carotid pulses 3+ soft bruit on the left no bruit on the right Abdomen soft nontender with no masses Neurologic normal Skin free of rashes Muscle skeletal free major deformities Lower extremity 3+ femoral and posterior tibial pulses palpable  Today I reviewed a carotid duplex done in our office on 01/15/2011 which reveals that   the left internal carotid stenosis has progressed to 80% in severity    Assessment:     Severe left internal carotid stenosis-asymptomatic    Plan:     Left carotid endarterectomy scheduled for Wednesday, January 18 at [facility]. Risks and benefits of been thoroughly discussed with patient and he would like to proceed      

## 2011-02-12 NOTE — Progress Notes (Signed)
ANTIBIOTIC CONSULT NOTE - FOLLOW UP  Pharmacy Consult for adjusting post-op abx for renal function   Allergies  Allergen Reactions  . Penicillins Other (See Comments)    Does not work    Patient Measurements: Height: 5\' 11"  (180.3 cm) Weight: 209 lb (94.802 kg) IBW/kg (Calculated) : 75.3  Adjusted Body Weight:   Vital Signs: Temp: 97.5 F (36.4 C) (01/16 1330) Temp src: Oral (01/16 0619) BP: 121/75 mmHg (01/16 1330) Pulse Rate: 77  (01/16 1330) Intake/Output from previous day:   Intake/Output from this shift: Total I/O In: 1200 [I.V.:1200] Out: 130 [Blood:130]    Microbiology: Recent Results (from the past 720 hour(s))  SURGICAL PCR SCREEN     Status: Abnormal   Collection Time   02/07/11  8:53 AM      Component Value Range Status Comment   MRSA, PCR NEGATIVE  NEGATIVE  Final    Staphylococcus aureus POSITIVE (*) NEGATIVE  Final     Anti-infectives     Start     Dose/Rate Route Frequency Ordered Stop   02/12/11 2000   vancomycin (VANCOCIN) IVPB 1000 mg/200 mL premix        1,000 mg 200 mL/hr over 60 Minutes Intravenous Every 12 hours 02/12/11 1428 02/13/11 1959   02/12/11 1430   vancomycin (VANCOCIN) IVPB 1000 mg/200 mL premix  Status:  Discontinued        1,000 mg 200 mL/hr over 60 Minutes Intravenous Every 12 hours 02/12/11 1423 02/12/11 1428   02/12/11 0600   vancomycin (VANCOCIN) IVPB 1000 mg/200 mL premix        1,000 mg 200 mL/hr over 60 Minutes Intravenous On call to O.R. 02/11/11 1440 02/12/11 0830          Assessment: 65 YOM s/p carotid endartertectomy, vancomycin prescribed post op.    Plan:  1. Vancomycin 1gm IV q12h x 2 doses is approrpriate  Dannielle Huh 02/12/2011,2:51 PM

## 2011-02-12 NOTE — Interval H&P Note (Signed)
History and Physical Interval Note:  02/12/2011 8:21 AM  [name]  has presented today for surgery, with the diagnosis of Left ICA Stenosis-asymptomatic  The various methods of treatment have been discussed with the patient and family. After consideration of risks, benefits and other options for treatment, the patient has consented to  Procedure(s): ENDARTERECTOMY CAROTID as a surgical intervention .  The patients' history has been reviewed, patient examined, no change in status, stable for surgery.  I have reviewed the patients' chart and labs.  Questions were answered to the patient's satisfaction.     Librada Castronovo D

## 2011-02-12 NOTE — Anesthesia Postprocedure Evaluation (Signed)
  Anesthesia Post-op Note  Patient: [name]  Procedure(s) Performed:  ENDARTERECTOMY CAROTID  Patient Location: PACU  Anesthesia Type: General  Level of Consciousness: awake, oriented, sedated and patient cooperative  Airway and Oxygen Therapy: Patient Spontanous Breathing and Patient connected to nasal cannula oxygen  Post-op Pain: mild  Post-op Assessment: Post-op Vital signs reviewed, Patient's Cardiovascular Status Stable, Respiratory Function Stable, Patent Airway, NAUSEA AND VOMITING PRESENT and Pain level controlled  Post-op Vital Signs: stable  Complications: No apparent anesthesia complications

## 2011-02-13 ENCOUNTER — Encounter (HOSPITAL_COMMUNITY): Payer: Self-pay | Admitting: Vascular Surgery

## 2011-02-13 LAB — BASIC METABOLIC PANEL
GFR calc Af Amer: >90 mL/min (ref >90)
GFR calc non Af Amer: >90 mL/min (ref >90)
Potassium: 3.8 mEq/L (ref 3.5–5.1)
Sodium: 136 mEq/L (ref 135–145)

## 2011-02-13 LAB — CBC
MCHC: 35.2 g/dL (ref 30.0–36.0)
Platelets: 155 10*3/uL (ref 150–400)
RDW: 13.1 % (ref 11.5–15.5)

## 2011-02-13 MED ORDER — HYDROCODONE-ACETAMINOPHEN 5-500 MG PO TABS: 1.0000 | ORAL_TABLET | Freq: Four times a day (QID) | ORAL | Status: AC | PRN

## 2011-02-13 NOTE — Progress Notes (Addendum)
VASCULAR AND VEIN SURGERY POST - OP CEA PROGRESS NOTE  Date of Surgery: 02/12/2011 Surgeon: Surgeon(s): Pryor Ochoa, MD 1 Day Post-Op left Carotid Endarterectomy .  HPI: [name] is a 66 y.o. male who is 1 Day Post-Op left Carotid Endarterectomy . Patient is doing well.  Patient states he has a mild headache this am; Patient denies difficulty swallowing; denies weakness in upper or lower extremities; Pt. Notes mouth droop on left  Significant Diagnostic Studies: CBC    Component Value Date/Time   WBC 13.0* 02/13/2011 0405   RBC 4.88 02/13/2011 0405   HGB 14.5 02/13/2011 0405   HCT 41.2 02/13/2011 0405   PLT 155 02/13/2011 0405   MCV 84.4 02/13/2011 0405   MCH 29.7 02/13/2011 0405   MCHC 35.2 02/13/2011 0405   RDW 13.1 02/13/2011 0405   LYMPHSABS 2.3 02/07/2011 0852   MONOABS 0.6 02/07/2011 0852   EOSABS 0.2 02/07/2011 0852   BASOSABS 0.0 02/07/2011 0852    BMET    Component Value Date/Time   NA 136 02/13/2011 0405   K 3.8 02/13/2011 0405   CL 101 02/13/2011 0405   CO2 26 02/13/2011 0405   GLUCOSE 152* 02/13/2011 0405   BUN 12 02/13/2011 0405   CREATININE 0.63 02/13/2011 0405   CALCIUM 9.5 02/13/2011 0405   GFRNONAA >90 02/13/2011 0405   GFRAA >90 02/13/2011 0405    COAG Lab Results  Component Value Date   INR 1.10 02/07/2011   No results found for this basename: PTT      Intake/Output Summary (Last 24 hours) at 02/13/11 0734 Last data filed at 02/12/11 1300  Gross per 24 hour  Intake   1200 ml  Output    130 ml  Net   1070 ml    Physical Exam:  BP Readings from Last 3 Encounters:  02/13/11 148/71  02/13/11 148/71  02/07/11 122/70   Temp Readings from Last 3 Encounters:  02/13/11 98.5 F (36.9 C) Oral  02/13/11 98.5 F (36.9 C) Oral  02/07/11 99.2 F (37.3 C) Oral   SpO2 Readings from Last 3 Encounters:  02/13/11 94%  02/13/11 94%  02/07/11 97%   Pulse Readings from Last 3 Encounters:  02/13/11 95  02/13/11 95  02/07/11 80    Pt is A&O x  3 Gait is normal Speech is fluent left Neck Wound is healing well Patient with Negative tongue deviation and Positive facial droop - flatening of left side of mouth Pt has good and equal strength in all extremities  Assessment: [name] is a 66 y.o. male is S/P Carotid endarterectomy Pt is voiding, ambulating and taking po well Positive facial droop on left secondary to retraction for High Carotid lesion  Plan: Discharge to: Home Follow-up in 2 weeks   Marlowe Shores [phone] 02/13/2011 7:34 AM   Doing well.DC home today. Marg mand n paresis should resolve. F/U in 2 weeks.

## 2011-02-13 NOTE — Progress Notes (Signed)
Discharge instructions given to patient and spouse.  Both verbalized understanding with all questions answered.  Pt discharged home with wife.  Roselie Awkward, RN

## 2011-02-13 NOTE — Discharge Summary (Signed)
[facility] Discharge Summary   Patient ID:  [name] MRN: [id number] DOB/AGE: [birth year] 65 y.o.  Admit date: 02/12/2011 Discharge date: 02/13/2011 Date of Surgery: 02/12/2011 Surgeon: Surgeon(s): Pryor Ochoa, MD  Admission Diagnosis: Left ICA Stenosis asymptomatic  Discharge Diagnoses:  Left ICA Stenosis asymptomatic  Secondary Diagnoses: Past Medical History  Diagnosis Date  . Carotid artery occlusion   . Hyperlipidemia     takes Lipitor daily  . Complication of anesthesia     found out allergic to ether at age 65 with tonsillectomy;coughing for 10days  . Hypertension     takes Verapamil daily and Losartan daily  . Pneumonia     double pneumonia as  a baby  . Concussion     at age 34  . Joint pain   . Herniated disc     diagnosed by Dr.Botero 15+yrs ago;no problems since  . Hepatitis     at age 83-Hep A  . Cancer     prostate  . Kidney stone     hx of in 2010  . Diabetes mellitus     takes Metformin and Januvia daily    Procedures: Procedure(s): ENDARTERECTOMY CAROTID  Discharged Condition: good  HPI:  [name] is a 66 y.o. male healthy male was seen by Dr. Hart Rochester for further followup regarding his carotid occlusive disease. He remains asymptomatic. He has no history of stroke or TIAs. He has no history of coronary artery disease and denies any chest pain or cardiac symptoms. We have been following his carotid disease for the past 3 years and it is now progressed to 80% in severity on the left side. He is admitted for elective left carotid endarterectomy    Hospital Course:  [name] is a 66 y.o. male is S/P Left Procedure(s): ENDARTERECTOMY CAROTID Extubated: POD # 0 Post-op wounds healing well Pt. Ambulating, voiding and taking PO diet without difficulty. Pt pain controlled with PO pain meds. Labs as below Complications:post -op facial droop secondary to retraction      Significant Diagnostic  Studies: CBC    Component Value Date/Time   WBC 13.0* 02/13/2011 0405   RBC 4.88 02/13/2011 0405   HGB 14.5 02/13/2011 0405   HCT 41.2 02/13/2011 0405   PLT 155 02/13/2011 0405   MCV 84.4 02/13/2011 0405   MCH 29.7 02/13/2011 0405   MCHC 35.2 02/13/2011 0405   RDW 13.1 02/13/2011 0405   LYMPHSABS 2.3 02/07/2011 0852   MONOABS 0.6 02/07/2011 0852   EOSABS 0.2 02/07/2011 0852   BASOSABS 0.0 02/07/2011 0852    BMET    Component Value Date/Time   NA 136 02/13/2011 0405   K 3.8 02/13/2011 0405   CL 101 02/13/2011 0405   CO2 26 02/13/2011 0405   GLUCOSE 152* 02/13/2011 0405   BUN 12 02/13/2011 0405   CREATININE 0.63 02/13/2011 0405   CALCIUM 9.5 02/13/2011 0405   GFRNONAA >90 02/13/2011 0405   GFRAA >90 02/13/2011 0405    COAG Lab Results  Component Value Date   INR 1.10 02/07/2011     Disposition:  Discharge to :Home Discharge Orders    Future Orders Please Complete By Expires   Resume previous diet      Driving Restrictions      Comments:   No driving for 2 weeks   Lifting restrictions      Comments:   No lifting for 4 weeks   Call MD for:  temperature >100.5  Call MD for:  redness, tenderness, or signs of infection (pain, swelling, bleeding, redness, odor or green/yellow discharge around incision site)      Call MD for:  severe or increased pain, loss or decreased feeling  in affected limb(s)      Increase activity slowly      Comments:   Walk with assistance use walker or cane as needed   May shower       Scheduling Instructions:   Friday   No dressing needed      may wash over wound with mild soap and water      CAROTID Sugery: Call MD for difficulty swallowing or speaking; weakness in arms or legs that is a new symtom; severe headache.  If you have increased swelling in the neck and/or  are having difficulty breathing, CALL 911         Imad, Shostak  Home Medication Instructions ZOX:[id number]   Printed on:02/13/11 1135  Medication Information                     Multiple Vitamin (MULTIVITAMIN) tablet Take 1 tablet by mouth daily.             aspirin 81 MG tablet Take 81 mg by mouth 2 (two) times daily.            Naproxen Sodium (ALEVE) 220 MG CAPS Take by mouth daily as needed.             chlorthalidone (HYGROTON) 25 MG tablet Take 25 mg by mouth daily.             verapamil (COVERA HS) 240 MG (CO) 24 hr tablet Take 240 mg by mouth 2 (two) times daily.            potassium chloride (K-DUR) 10 MEQ tablet Take 10 mEq by mouth 2 (two) times daily.            metFORMIN (GLUCOPHAGE) 500 MG tablet Take 1,000 mg by mouth 2 (two) times daily.            losartan (COZAAR) 100 MG tablet Take 100 mg by mouth daily.             atorvastatin (LIPITOR) 10 MG tablet Take 10 mg by mouth daily.             sitaGLIPtin (JANUVIA) 100 MG tablet Take 100 mg by mouth daily.             HYDROcodone-acetaminophen (VICODIN) 5-500 MG per tablet Take 1 tablet by mouth every 6 (six) hours as needed for pain.            Verbal and written Discharge instructions given to the patient. Wound care per Discharge AVS   Signed: Marlowe Shores 02/13/2011, 11:35 AM

## 2011-02-13 NOTE — Plan of Care (Signed)
Problem: Consults Goal: Diagnosis CEA/CES/AAA Stent Carotid Endarterectomy (CEA)     

## 2011-02-14 ENCOUNTER — Telehealth: Payer: Self-pay

## 2011-02-14 NOTE — Telephone Encounter (Signed)
Pt. Is s/p left CEA of 02/12/11.  Called to report incisional drainage during sleep last night.  Stated he laid on the left side during night, and woke up to approx. 2-3 cc of bloody drainage on his sheets.  States incision is intact without any open areas.  Describes incisional area as being somewhat swollen, but that the swelling has improved.  Denies any fever.  Denies any pulsatile mass, when asked to palpate area.  Encouraged to keep area clean and dry.  Encouraged to call if sx's worsen.  Verbalized understanding.

## 2011-02-19 ENCOUNTER — Telehealth: Payer: Self-pay

## 2011-02-19 NOTE — Telephone Encounter (Signed)
Phone call from pt.  Reported that his blood pressure has been increasing over past 2 days.  BP readings reported:  BP 130/60 on 1/21, BP 160/95 on 1/22, BP 190/110 this morning.  (Pt. is s/p Left CEA of 02/12/11.)  Pt. denies any headache, visual disturbance, weakness in extremities, difficulty with speech. Denies any other unusual symptoms.  States that the soreness in left neck incision has "markedly decreased", and that the swelling has improved.   Advised pt. To call his PCP to report increase in BP.  Also, discussed pt's BP and negative symptoms with Dr. Edilia Bo.  Advised also, that pt. needs to contact PCP.  Phone call to pt. and reported to him Dr. Adele Dan recommendation.  Agrees w/ plan.

## 2011-03-03 ENCOUNTER — Encounter: Payer: Self-pay | Admitting: Vascular Surgery

## 2011-03-04 ENCOUNTER — Ambulatory Visit: Payer: Medicare Other | Admitting: Vascular Surgery

## 2011-03-04 ENCOUNTER — Encounter: Payer: Self-pay | Admitting: Vascular Surgery

## 2011-03-04 VITALS — BP 118/77 | HR 74 | Resp 16 | Ht 71.0 in | Wt 200.0 lb

## 2011-03-04 DIAGNOSIS — I6529 Occlusion and stenosis of unspecified carotid artery: Secondary | ICD-10-CM

## 2011-03-04 NOTE — Progress Notes (Signed)
Subjective:     Patient ID: [name], male   DOB: 01-Aug-1945, 66 y.o.   MRN: [id number]  HPI this 66 year old male patient returns for initial followup regarding his recent left carotid endarterectomy for severe but asymptomatic left internal carotid stenosis. He had an uneventful surgical procedure. He has had no lateralizing weakness, aphasia, amaurosis fugax, diplopia, blurred vision, or syncope since his surgery. He is swallowing well and has no hoarseness. He is taking one 81 mg aspirin tablet per day. He has some mild left lower lip weakness from a known marginal mandibular nerve (which should resolve with time.  Review of Systems     Objective:   Physical ExamBP 118/77  Pulse 74  Resp 16  Ht 5\' 11"  (1.803 m)  Wt 200 lb (90.719 kg)  BMI 27.89 kg/m2  SpO2 98%   general well-developed well-nourished male in no apparent distress alert and oriented x3 Left neck incision healing nicely 3+ carotid pulse with no audible bruit Neurologic exam normal with the exception of mild left marginal mandibular nerve paresis Assessment:     Doing well post left carotid endarterectomy for asymptomatic severe stenosis Mild contralateral right internal carotid stenosis    Plan:     Return in 6 months for carotid duplex exam unless develops any neurologic symptoms in the interim Marginal mandibular nerve parasystole resolve over the next few months Continue aspirin daily

## 2011-04-03 ENCOUNTER — Other Ambulatory Visit: Payer: Self-pay | Admitting: Family Medicine

## 2011-04-03 ENCOUNTER — Ambulatory Visit
Admission: RE | Admit: 2011-04-03 | Discharge: 2011-04-03 | Disposition: A | Discharge status: Home / Self Care | Payer: Medicare Other | Source: Ambulatory Visit | Attending: Family Medicine | Admitting: Family Medicine

## 2011-04-03 DIAGNOSIS — M25512 Pain in left shoulder: Secondary | ICD-10-CM

## 2011-04-29 ENCOUNTER — Other Ambulatory Visit: Payer: Self-pay | Admitting: Family Medicine

## 2011-04-29 DIAGNOSIS — M542 Cervicalgia: Secondary | ICD-10-CM

## 2011-05-05 ENCOUNTER — Ambulatory Visit
Admission: RE | Admit: 2011-05-05 | Discharge: 2011-05-05 | Disposition: A | Discharge status: Home / Self Care | Payer: Medicare Other | Source: Ambulatory Visit | Attending: Family Medicine | Admitting: Family Medicine

## 2011-05-05 DIAGNOSIS — M542 Cervicalgia: Secondary | ICD-10-CM

## 2011-05-06 ENCOUNTER — Ambulatory Visit
Admission: RE | Admit: 2011-05-06 | Discharge: 2011-05-06 | Disposition: A | Discharge status: Home / Self Care | Payer: Medicare Other | Source: Ambulatory Visit | Attending: Family Medicine | Admitting: Family Medicine

## 2011-05-06 ENCOUNTER — Other Ambulatory Visit: Payer: Self-pay | Admitting: Family Medicine

## 2011-05-06 DIAGNOSIS — M4800 Spinal stenosis, site unspecified: Secondary | ICD-10-CM

## 2011-05-06 MED ORDER — TRIAMCINOLONE ACETONIDE 40 MG/ML IJ SUSP (RADIOLOGY)
60.0000 mg | Freq: Once | INTRAMUSCULAR | Status: AC
  Administered 2011-05-06: 60 mg via EPIDURAL

## 2011-05-06 MED ORDER — IOHEXOL 300 MG/ML  SOLN
1.0000 mL | Freq: Once | INTRAMUSCULAR | Status: AC | PRN
  Administered 2011-05-06: 1 mL via EPIDURAL

## 2011-05-06 NOTE — Discharge Instructions (Signed)

## 2011-05-20 ENCOUNTER — Other Ambulatory Visit: Payer: Self-pay | Admitting: Family Medicine

## 2011-05-20 DIAGNOSIS — M25519 Pain in unspecified shoulder: Secondary | ICD-10-CM

## 2011-05-20 DIAGNOSIS — M542 Cervicalgia: Secondary | ICD-10-CM

## 2011-05-22 ENCOUNTER — Ambulatory Visit
Admission: RE | Admit: 2011-05-22 | Discharge: 2011-05-22 | Disposition: A | Discharge status: Home / Self Care | Payer: Medicare Other | Source: Ambulatory Visit | Attending: Family Medicine | Admitting: Family Medicine

## 2011-05-22 DIAGNOSIS — M542 Cervicalgia: Secondary | ICD-10-CM

## 2011-05-22 DIAGNOSIS — M25519 Pain in unspecified shoulder: Secondary | ICD-10-CM

## 2011-05-22 MED ORDER — TRIAMCINOLONE ACETONIDE 40 MG/ML IJ SUSP (RADIOLOGY)
60.0000 mg | Freq: Once | INTRAMUSCULAR | Status: AC
  Administered 2011-05-22: 60 mg via EPIDURAL

## 2011-05-22 MED ORDER — IOHEXOL 300 MG/ML  SOLN
1.0000 mL | Freq: Once | INTRAMUSCULAR | Status: AC | PRN
  Administered 2011-05-22: 1 mL via EPIDURAL

## 2011-06-27 ENCOUNTER — Other Ambulatory Visit: Payer: Self-pay | Admitting: Family Medicine

## 2011-06-27 DIAGNOSIS — M542 Cervicalgia: Secondary | ICD-10-CM

## 2011-07-03 ENCOUNTER — Ambulatory Visit
Admission: RE | Admit: 2011-07-03 | Discharge: 2011-07-03 | Disposition: A | Discharge status: Home / Self Care | Payer: Medicare Other | Source: Ambulatory Visit | Attending: Family Medicine | Admitting: Family Medicine

## 2011-07-03 DIAGNOSIS — M542 Cervicalgia: Secondary | ICD-10-CM

## 2011-07-03 MED ORDER — IOHEXOL 300 MG/ML  SOLN
1.0000 mL | Freq: Once | INTRAMUSCULAR | Status: AC | PRN
  Administered 2011-07-03: 1 mL via EPIDURAL

## 2011-07-03 MED ORDER — TRIAMCINOLONE ACETONIDE 40 MG/ML IJ SUSP (RADIOLOGY)
60.0000 mg | Freq: Once | INTRAMUSCULAR | Status: AC
  Administered 2011-07-03: 60 mg via EPIDURAL

## 2011-09-08 ENCOUNTER — Encounter: Payer: Self-pay | Admitting: Neurosurgery

## 2011-09-09 ENCOUNTER — Encounter: Payer: Self-pay | Admitting: Neurosurgery

## 2011-09-09 ENCOUNTER — Ambulatory Visit (INDEPENDENT_AMBULATORY_CARE_PROVIDER_SITE_OTHER): Payer: Medicare Other | Admitting: Neurosurgery

## 2011-09-09 ENCOUNTER — Other Ambulatory Visit (INDEPENDENT_AMBULATORY_CARE_PROVIDER_SITE_OTHER): Payer: Medicare Other

## 2011-09-09 VITALS — BP 108/74 | HR 68 | Resp 16 | Ht 70.75 in | Wt 202.7 lb

## 2011-09-09 DIAGNOSIS — Z48812 Encounter for surgical aftercare following surgery on the circulatory system: Secondary | ICD-10-CM

## 2011-09-09 DIAGNOSIS — I6529 Occlusion and stenosis of unspecified carotid artery: Secondary | ICD-10-CM

## 2011-09-09 NOTE — Progress Notes (Signed)
[name] Office Note  CC: Initial six-month postop left Graham endarterectomy Referring Physician: Hart Rochester  History of Present Illness: 66 year old male patient of Dr. Hart Rochester who is status post a left Graham endarterectomy in January 2013. Dr. Hart Rochester saw the patient with me today. The patient denies any signs or symptoms of CVA, TIA, amaurosis fugax and his facial symmetry is appropriate. The patient denies any new medical diagnoses or recent surgery.  Past Medical History  Diagnosis Date  . Graham artery occlusion   . Hyperlipidemia     takes Lipitor daily  . Complication of anesthesia     found out allergic to ether at age 58 with tonsillectomy;coughing for 10days  . Hypertension     takes Verapamil daily and Losartan daily  . Pneumonia     double pneumonia as  a baby  . Concussion     at age 45  . Joint pain   . Herniated disc     diagnosed by Dr.Botero 15+yrs ago;no problems since  . Hepatitis     at age 11-Hep A  . Cancer     prostate  . Kidney stone     hx of in 2010  . Diabetes mellitus     takes Metformin and Januvia daily    ROS: [x]  Positive   [ ]  Denies    General: [ ]  Weight loss, [ ]  Fever, [ ]  chills Neurologic: [ ]  Dizziness, [ ]  Blackouts, [ ]  Seizure [ ]  Stroke, [ ]  "Mini stroke", [ ]  Slurred speech, [ ]  Temporary blindness; [ ]  weakness in arms or legs, [ ]  Hoarseness Cardiac: [ ]  Chest pain/pressure, [ ]  Shortness of breath at rest [ ]  Shortness of breath with exertion, [ ]  Atrial fibrillation or irregular heartbeat Vascular: [ ]  Pain in legs with walking, [ ]  Pain in legs at rest, [ ]  Pain in legs at night,  [ ]  Non-healing ulcer, [ ]  Blood clot in vein/DVT,   Pulmonary: [ ]  Home oxygen, [ ]  Productive cough, [ ]  Coughing up blood, [ ]  Asthma,  [ ]  Wheezing Musculoskeletal:  [ ]  Arthritis, [ ]  Low back pain, [ ]  Joint pain Hematologic: [ ]  Easy Bruising, [ ]  Anemia; [ ]  Hepatitis Gastrointestinal: [ ]  Blood in  stool, [ ]  Gastroesophageal Reflux/heartburn, [ ]  Trouble swallowing Urinary: [ ]  chronic Kidney disease, [ ]  on HD - [ ]  MWF or [ ]  TTHS, [ ]  Burning with urination, [ ]  Difficulty urinating Skin: [ ]  Rashes, [ ]  Wounds Psychological: [ ]  Anxiety, [ ]  Depression   Social History History  Substance Use Topics  . Smoking status: Former Games developer  . Smokeless tobacco: Never Used   Comment: quit 39yrs ago  . Alcohol Use: Yes     occ wine every wee    Family History Family History  Problem Relation Age of Onset  . Heart disease Mother   . Heart disease Father 67    Myocardial Infarction  . Diabetes Father   . Heart disease Brother     CABG history  . Stroke Brother   . Stroke Maternal Grandfather   . Stroke Paternal Grandfather   . Anesthesia problems Neg Hx   . Hypotension Neg Hx   . Malignant hyperthermia Neg Hx   . Pseudochol deficiency Neg Hx     Allergies  Allergen Reactions  . Penicillins Other (See Comments)    Does not work    Current Outpatient Prescriptions  Medication Sig Dispense Refill  . aspirin 81 MG tablet Take 81 mg by mouth 2 (two) times daily.       Marland Kitchen atorvastatin (LIPITOR) 10 MG tablet Take 10 mg by mouth daily.        . chlorthalidone (HYGROTON) 25 MG tablet Take 25 mg by mouth daily.        Marland Kitchen losartan (COZAAR) 100 MG tablet Take 100 mg by mouth daily.        . metFORMIN (GLUCOPHAGE) 500 MG tablet Take 1,000 mg by mouth 2 (two) times daily.       . metoprolol (LOPRESSOR) 50 MG tablet Take 50 mg by mouth 2 (two) times daily.      . Multiple Vitamin (MULTIVITAMIN) tablet Take 1 tablet by mouth daily.        . polyethylene glycol-electrolytes (NULYTELY/GOLYTELY) 420 G solution as needed.      . potassium chloride (K-DUR) 10 MEQ tablet Take 10 mEq by mouth 2 (two) times daily.       . sitaGLIPtin (JANUVIA) 100 MG tablet Take 100 mg by mouth daily.        . verapamil (COVERA HS) 240 MG (CO) 24 hr tablet Take 240 mg by mouth 2 (two) times daily.       .  metoprolol succinate (TOPROL-XL) 50 MG 24 hr tablet Take 50 mg by mouth daily. Take with or immediately following a meal.      . Naproxen Sodium (ALEVE) 220 MG CAPS Take by mouth daily as needed.          Physical Examination  Filed Vitals:   09/09/11 1035  BP: 108/74  Pulse: 68  Resp:     Body mass index is 28.47 kg/(m^2).  General:  WDWN in NAD Gait: Normal HEENT: WNL Eyes: Pupils equal Pulmonary: normal non-labored breathing , without Rales, rhonchi,  wheezing Cardiac: RRR, without  Murmurs, rubs or gallops; Abdomen: soft, NT, no masses Skin: no rashes, ulcers noted  Vascular Exam Pulses: Palpable radial pulses bilaterally Graham bruits: Graham pulses to auscultation no bruits are heard per Dr. Hart Rochester Extremities without ischemic changes, no Gangrene , no cellulitis; no open wounds;  Musculoskeletal: no muscle wasting or atrophy   Neurologic: A&O X 3; Appropriate Affect ; SENSATION: normal; MOTOR FUNCTION:  moving all extremities equally. Speech is fluent/normal  Non-Invasive Vascular Imaging Graham DUPLEX 09/09/2011  Right ICA 20 - 39 % stenosis Left ICA 0 - 19% stenosis   ASSESSMENT/PLAN: Asymptomatic patient 6 months status post left CEA and doing well. The patient will followup in 6 months with repeat Graham duplex, his questions were encouraged and answered, he is in agreement with this plan.  Lauree Chandler ANP   Clinic MD: Hart Rochester

## 2011-09-09 NOTE — Addendum Note (Signed)
Addended by: Sharee Pimple on: 09/09/2011 01:54 PM   Modules accepted: Orders

## 2011-09-12 ENCOUNTER — Other Ambulatory Visit: Payer: Self-pay | Admitting: Gastroenterology

## 2012-02-19 ENCOUNTER — Other Ambulatory Visit: Payer: Self-pay | Admitting: Family Medicine

## 2012-02-19 DIAGNOSIS — M5412 Radiculopathy, cervical region: Secondary | ICD-10-CM

## 2012-02-26 ENCOUNTER — Other Ambulatory Visit: Payer: Medicare Other

## 2012-02-27 ENCOUNTER — Ambulatory Visit
Admission: RE | Admit: 2012-02-27 | Discharge: 2012-02-27 | Disposition: A | Discharge status: Home / Self Care | Payer: Medicare Other | Source: Ambulatory Visit | Attending: Family Medicine | Admitting: Family Medicine

## 2012-02-27 DIAGNOSIS — M5412 Radiculopathy, cervical region: Secondary | ICD-10-CM

## 2012-03-08 ENCOUNTER — Encounter: Payer: Self-pay | Admitting: Vascular Surgery

## 2012-03-09 ENCOUNTER — Ambulatory Visit (INDEPENDENT_AMBULATORY_CARE_PROVIDER_SITE_OTHER): Payer: Medicare Other | Admitting: Vascular Surgery

## 2012-03-09 ENCOUNTER — Encounter: Payer: Self-pay | Admitting: Vascular Surgery

## 2012-03-09 VITALS — BP 135/88 | HR 73 | Resp 20 | Ht 71.0 in | Wt 208.7 lb

## 2012-03-09 DIAGNOSIS — Z48812 Encounter for surgical aftercare following surgery on the circulatory system: Secondary | ICD-10-CM

## 2012-03-09 DIAGNOSIS — I6529 Occlusion and stenosis of unspecified carotid artery: Secondary | ICD-10-CM

## 2012-03-09 NOTE — Progress Notes (Signed)
Subjective:     Patient ID: [name], male   DOB: [birth year], 67 y.o.   MRN: [id number]  HPI this 67 year old male returns for further followup regarding his left carotid endarterectomy performed by me one year ago. This was an asymptomatic severe stenosis detected by [name]. Patient has done well with no neurologic complications or specific symptoms related to his vasculature. He does have a cervical disc problem causing weakness in his left arm and may require surgery at [facility] in the near future. He has had no aphasia, lateralizing transient weakness, amaurosis fugax, diplopia, blurred vision, or syncope. He continues to take an 81 mg aspirin tablet twice daily.  Past Medical History  Diagnosis Date  . Carotid artery occlusion   . Hyperlipidemia     takes Lipitor daily  . Complication of anesthesia     found out allergic to ether at age 87 with tonsillectomy;coughing for 10days  . Hypertension     takes Verapamil daily and Losartan daily  . Pneumonia     double pneumonia as  a baby  . Concussion     at age 39  . Joint pain   . Herniated disc     diagnosed by [name] 15+yrs ago;no problems since  . Hepatitis     at age 74-Hep A  . Cancer     prostate  . Kidney stone     hx of in 2010  . Diabetes mellitus     takes Metformin and Januvia daily    History  Substance Use Topics  . Smoking status: Former Smoker -- 7 years    Types: Cigarettes    Quit date: 03/09/1989  . Smokeless tobacco: Never Used     Comment: quit 32yrs ago  . Alcohol Use: Yes     Comment: occ wine every wee    Family History  Problem Relation Age of Onset  . Heart disease Mother   . Heart disease Father 16    Myocardial Infarction  . Diabetes Father   . Heart disease Brother     CABG history  . Stroke Brother   . Stroke Maternal Grandfather   . Stroke Paternal Grandfather   . Anesthesia problems Neg Hx   . Hypotension Neg Hx   . Malignant hyperthermia Neg Hx   .  Pseudochol deficiency Neg Hx     Allergies  Allergen Reactions  . Penicillins Other (See Comments)    Does not work    Current outpatient prescriptions:aspirin 81 MG tablet, Take 81 mg by mouth 2 (two) times daily. , Disp: , Rfl: ;  atorvastatin (LIPITOR) 10 MG tablet, Take 20 mg by mouth daily. , Disp: , Rfl: ;  chlorthalidone (HYGROTON) 25 MG tablet, Take 25 mg by mouth daily.  , Disp: , Rfl: ;  losartan (COZAAR) 100 MG tablet, Take 100 mg by mouth daily.  , Disp: , Rfl:  metFORMIN (GLUCOPHAGE) 500 MG tablet, Take 1,000 mg by mouth 2 (two) times daily. , Disp: , Rfl: ;  metoprolol (LOPRESSOR) 50 MG tablet, Take 50 mg by mouth 2 (two) times daily., Disp: , Rfl: ;  Multiple Vitamin (MULTIVITAMIN) tablet, Take 1 tablet by mouth daily.  , Disp: , Rfl: ;  potassium chloride (K-DUR) 10 MEQ tablet, Take 10 mEq by mouth 2 (two) times daily. , Disp: , Rfl:  sitaGLIPtin (JANUVIA) 100 MG tablet, Take 100 mg by mouth daily.  , Disp: , Rfl: ;  metoprolol succinate (TOPROL-XL) 50 MG  24 hr tablet, Take 50 mg by mouth daily. Take with or immediately following a meal., Disp: , Rfl: ;  Naproxen Sodium (ALEVE) 220 MG CAPS, Take by mouth daily as needed.  , Disp: , Rfl: ;  polyethylene glycol-electrolytes (NULYTELY/GOLYTELY) 420 G solution, as needed., Disp: , Rfl:  verapamil (COVERA HS) 240 MG (CO) 24 hr tablet, Take 240 mg by mouth 2 (two) times daily. , Disp: , Rfl:   BP 135/88  Pulse 73  Resp 20  Ht 5\' 11"  (1.803 m)  Wt 208 lb 11.2 oz (94.666 kg)  BMI 29.12 kg/m2  Body mass index is 29.12 kg/(m^2).           Review of Systems Denies chest pain, dyspnea on exertion, PND, orthopnea, hemoptysis, claudication      Objective:   Physical Examblood pressure 135/88 heart rate 73 respirations 20 Gen.-alert and oriented x3 in no apparent distress HEENT normal for age Lungs no rhonchi or wheezing Cardiovascular regular rhythm no murmurs carotid pulses 3+ palpable no bruits audible Abdomen soft  nontender no palpable masses Musculoskeletal free of  major deformities Skin clear -no rashes Neurologic normal-no evidence of marginal mandibular nerve paresis Lower extremities 3+ femoral and dorsalis pedis pulses palpable bilaterally with no edema  Today I ordered a carotid duplex exam which I reviewed and interpreted. There is no evidence of restenosis of the left carotid endarterectomy site. Right internal carotid has minimal stenosis less than 40%.     Assessment:     Doing well 1 year post left carotid and arterectomy performed for severe asymptomatic stenosis Patient will likely require cervical spine surgery at [facility] in near future     Plan:      return in one year with carotid duplex exam unless he develops neurologic symptoms in the interim

## 2012-03-09 NOTE — Progress Notes (Signed)
Carotid duplex performed @ [facility] 03/09/2012

## 2012-03-10 NOTE — Addendum Note (Signed)
Addended by: Sharee Pimple on: 03/10/2012 09:52 AM   Modules accepted: Orders

## 2012-06-10 HISTORY — PX: SPINE SURGERY: SHX786

## 2013-02-06 IMAGING — CR DG CHEST 2V
2 series · 2 of 2 positions shown · non-contrast
Comparison: 02/22/2009

CLINICAL DATA: Preoperative respiratory exam.  Carotid artery
stenosis.

CHEST - 2 VIEW

[view not recorded (1 of 2)]
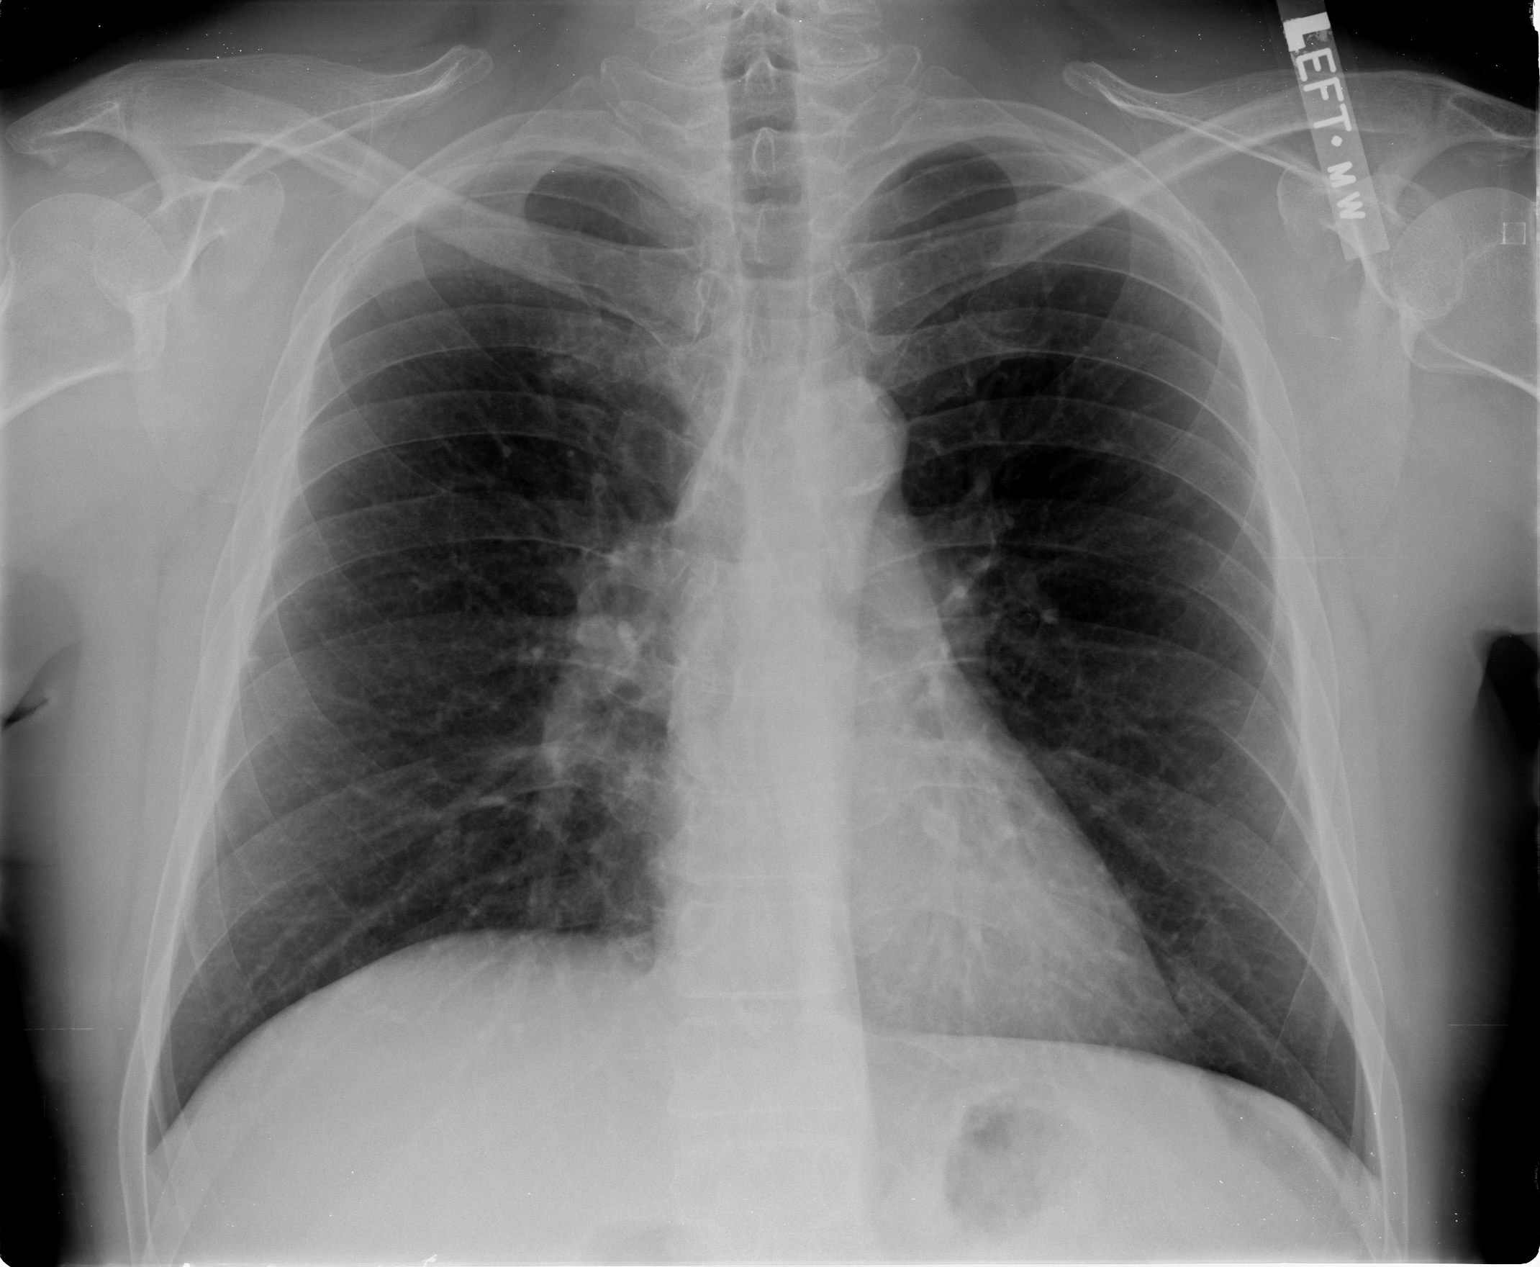

[view not recorded (2 of 2)]
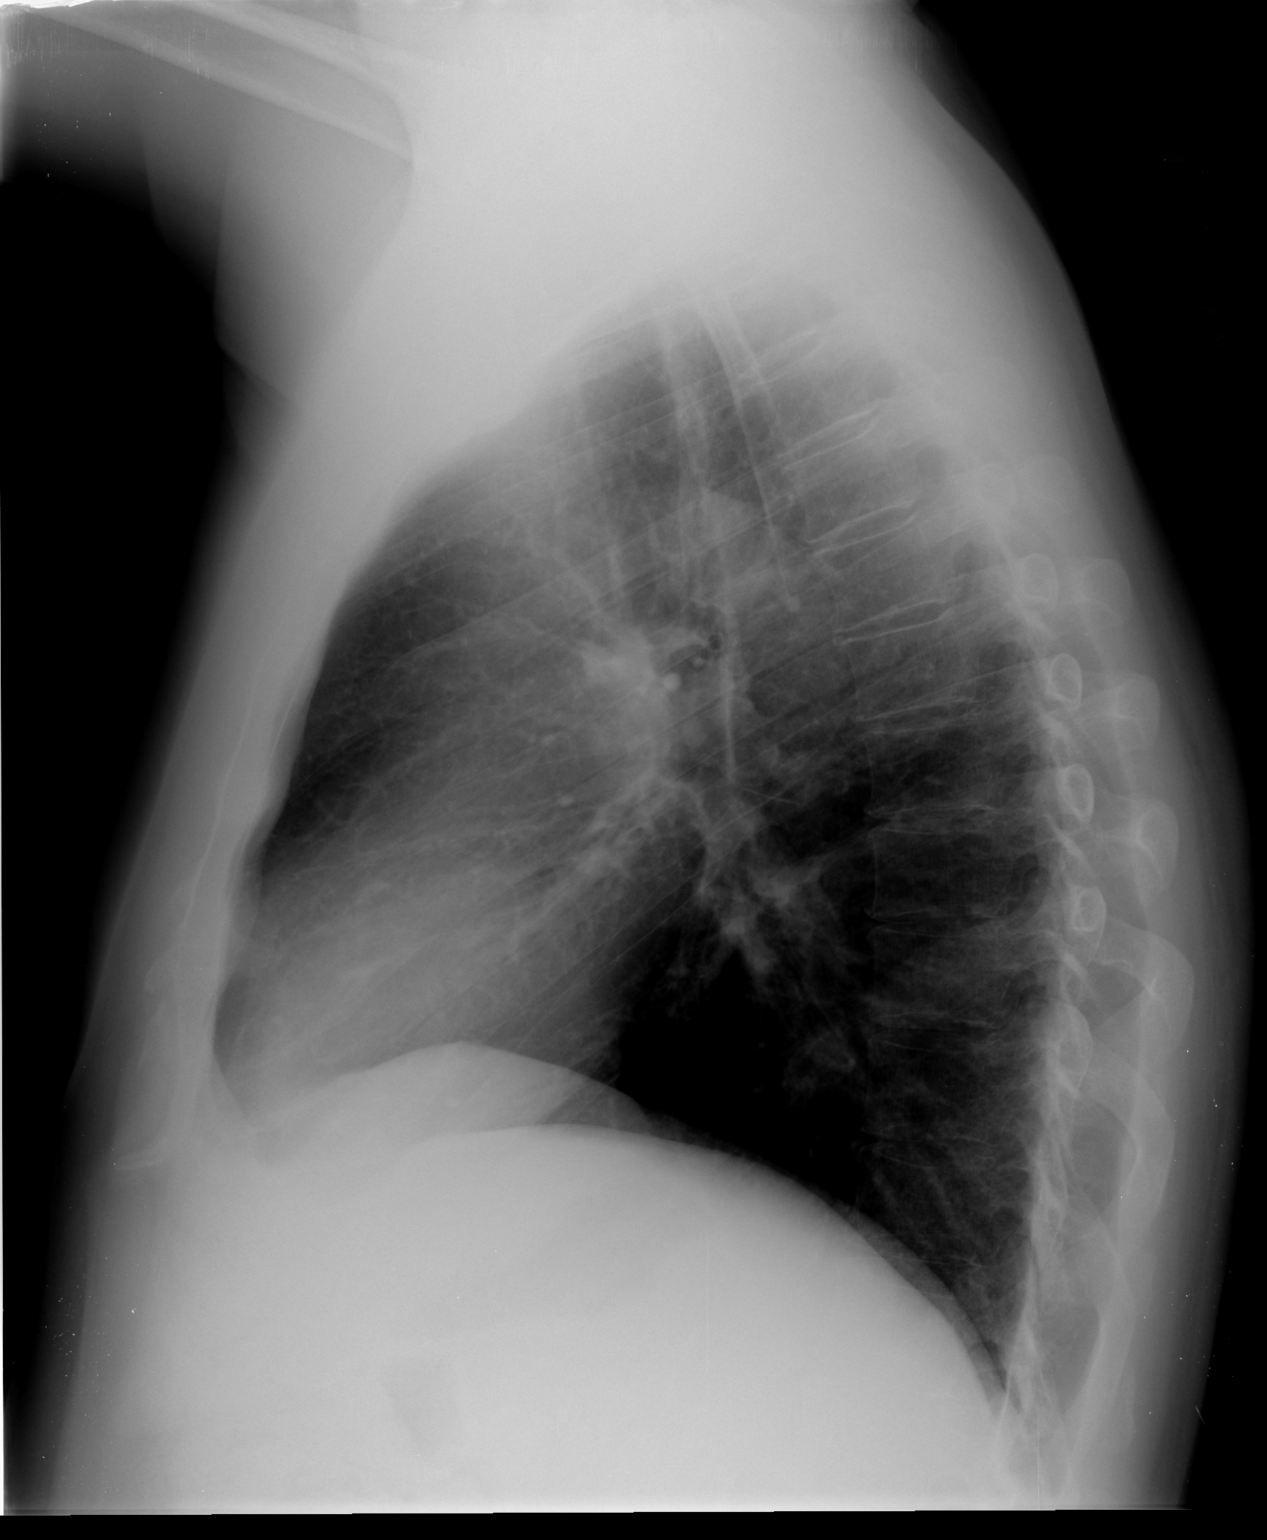

[2 of 2 positions shown; findings below may reference images not displayed]

FINDINGS: The heart size and vascularity are normal and the lungs
are clear.  No significant osseous abnormality.
IMPRESSION: No acute disease.

## 2013-03-14 ENCOUNTER — Ambulatory Visit: Payer: Medicare Other | Admitting: Family

## 2013-03-14 ENCOUNTER — Other Ambulatory Visit (HOSPITAL_COMMUNITY): Payer: Medicare Other

## 2013-04-01 ENCOUNTER — Encounter: Payer: Self-pay | Admitting: Family

## 2013-04-04 ENCOUNTER — Ambulatory Visit (INDEPENDENT_AMBULATORY_CARE_PROVIDER_SITE_OTHER): Payer: Medicare Other | Admitting: Family

## 2013-04-04 ENCOUNTER — Ambulatory Visit (HOSPITAL_COMMUNITY)
Admission: RE | Admit: 2013-04-04 | Discharge: 2013-04-04 | Disposition: A | Discharge status: Home / Self Care | Payer: Medicare Other | Source: Ambulatory Visit | Attending: Vascular Surgery | Admitting: Vascular Surgery

## 2013-04-04 ENCOUNTER — Encounter: Payer: Self-pay | Admitting: Family

## 2013-04-04 VITALS — BP 126/85 | HR 74 | Resp 16 | Ht 71.0 in | Wt 205.6 lb

## 2013-04-04 DIAGNOSIS — Z48812 Encounter for surgical aftercare following surgery on the circulatory system: Secondary | ICD-10-CM | POA: Insufficient documentation

## 2013-04-04 DIAGNOSIS — I6529 Occlusion and stenosis of unspecified carotid artery: Secondary | ICD-10-CM

## 2013-04-04 NOTE — Patient Instructions (Signed)

## 2013-04-04 NOTE — Progress Notes (Signed)
Established Carotid Patient   History of Present Illness  [name] is a 68 y.o. male patient of Dr. Kellie Simmering who is s/p left carotid endarterectomy on 02/12/2011. This was an asymptomatic severe stenosis detected by Dr. Derinda Late. Patient has done well with no neurologic complications or specific symptoms related to his vasculature.   He returns today for routine surveillance.  Patient has Negative history of TIA or stroke symptom.  The patient denies amaurosis fugax or monocular blindness.  The patient  denies facial drooping.  Pt. denies hemiplegia.  The patient denies receptive or expressive aphasia.  Pt. denies extremity weakness. He denies any history of MI, denies any claudication symptoms.  Patient reports New Medical or Surgical History: cervical fusion of C 4-6 by Dr. Jackolyn Confer at [facility]; he received immediate relief of symptoms.  Pt Diabetic: Yes, states last A1C was 5.7 Pt smoker: former smoker, quit 26, smoked for about 5 years ago  Pt meds include: Statin : Yes ASA: Yes Other anticoagulants/antiplatelets: no   Past Medical History  Diagnosis Date  . Carotid artery occlusion   . Hyperlipidemia     takes Lipitor daily  . 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 Hypertension     takes Verapamil daily and Losartan daily  . Pneumonia     double pneumonia as  a baby  . Concussion     at age 32  . Joint pain   . Herniated disc     diagnosed by Dr.Botero 15+yrs ago;no problems since  . Hepatitis     at age 3-Hep A  . Kidney stone     hx of in 2010  . Diabetes mellitus     takes Metformin and Januvia daily  . Anemia     at age 63 years old  . Cancer 2010    prostate    Social History History  Substance Use Topics  . Smoking status: Former Smoker -- 7 years    Types: Cigarettes    Quit date: 03/09/1989  . Smokeless tobacco: Never Used     Comment: quit 78yrs ago   . Alcohol Use: Yes     Comment: occ wine every wee    Family History Family History  Problem Relation Age of Onset  . Heart disease Mother   . Hypertension Mother   . Heart disease Father 47    Myocardial Infarction and Heart Disease before age 66  . Diabetes Father   . Hyperlipidemia Father   . Hypertension Father   . Heart attack Father   . Heart disease Brother     CABG history  . Stroke Brother   . Diabetes Brother   . Heart attack Brother   . Stroke Maternal Grandfather   . Stroke Paternal Grandfather   . Anesthesia problems Neg Hx   . Hypotension Neg Hx   . Malignant hyperthermia Neg Hx   . Pseudochol deficiency Neg Hx     Surgical History Past Surgical History  Procedure Laterality Date  . Prostate surgery  03-09-08    prostatectomy  . Tonsillectomy      as a child  . Appendectomy      at age 62  . Cyst removed      20+yrs ago  . Colonoscopy    . Endarterectomy  02/12/2011    Procedure: ENDARTERECTOMY CAROTID;  Surgeon: Mal Misty, MD;  Location:  [facility];  Service: Vascular;  Laterality: Left;  . Carotid endarterectomy  Jan. 16,2013    LEFT  cea  . Spine surgery  Jun 10, 2012    cervical-  c-4  c-5 and c-6    Allergies  Allergen Reactions  . Penicillins Other (See Comments)    Does not work    Current Outpatient Prescriptions  Medication Sig Dispense Refill  . aspirin 81 MG tablet Take 81 mg by mouth 2 (two) times daily.       Marland Kitchen atorvastatin (LIPITOR) 10 MG tablet Take 40 mg by mouth 2 (two) times daily.       . chlorthalidone (HYGROTON) 25 MG tablet Take 25 mg by mouth daily.        Marland Kitchen GLIPIZIDE XL 5 MG 24 hr tablet Take by mouth 2 (two) times daily.      Marland Kitchen losartan (COZAAR) 100 MG tablet Take 100 mg by mouth daily.        . metFORMIN (GLUCOPHAGE) 500 MG tablet Take 1,000 mg by mouth 2 (two) times daily.       . metoprolol succinate (TOPROL-XL) 50 MG 24 hr tablet Take 50 mg by mouth 2 (two) times daily. Take with or immediately following a meal.       . Multiple Vitamin (MULTIVITAMIN) tablet Take 1 tablet by mouth daily.        . Naproxen Sodium (ALEVE) 220 MG CAPS Take by mouth daily as needed.        . potassium chloride (K-DUR) 10 MEQ tablet Take 10 mEq by mouth 2 (two) times daily.       . sitaGLIPtin (JANUVIA) 100 MG tablet Take 100 mg by mouth daily.        . metoprolol (LOPRESSOR) 50 MG tablet Take 50 mg by mouth 2 (two) times daily.      . polyethylene glycol-electrolytes (NULYTELY/GOLYTELY) 420 G solution as needed.      . verapamil (COVERA HS) 240 MG (CO) 24 hr tablet Take 240 mg by mouth 2 (two) times daily.        No current facility-administered medications for this visit.    Review of Systems : See HPI for pertinent positives and negatives.  Physical Examination  Filed Vitals:   04/04/13 1450  BP: 126/85  Pulse: 74  Resp: 16   Filed Weights   04/04/13 1450  Weight: 205 lb 9.6 oz (93.26 kg)   Body mass index is 28.69 kg/(m^2).   General: WDWN male in NAD GAIT: normal Eyes: PERRLA Pulmonary:  Non-labored, CTAB, Negative  Rales, Negative rhonchi, & Negative wheezing.  Cardiac: regular Rhythm ,  Negative detected murmur.  VASCULAR EXAM Carotid Bruits Left Right   Negative Negative     Radial pulses are 2+ palpable and equal.  LE Pulses LEFT RIGHT       POPLITEAL   palpable    palpable       POSTERIOR TIBIAL  not palpable   not palpable        DORSALIS PEDIS      ANTERIOR TIBIAL  palpable   palpable     Gastrointestinal: soft, nontender, BS WNL, no r/g,  negative masses.  Musculoskeletal: Negative muscle atrophy/wasting. M/S 5/5 throughout, Extremities without ischemic changes.  Neurologic: A&O X 3; Appropriate Affect ; SENSATION ;normal;  Speech is normal CN 2-12 intact, Pain and light touch intact in extremities, Motor exam as listed above.   Non-Invasive Vascular  Imaging CAROTID DUPLEX 04/04/2013   CEREBROVASCULAR DUPLEX EVALUATION    INDICATION: Carotid artery disease     PREVIOUS INTERVENTION(S): Left carotid endarterectomy 02/12/2011    DUPLEX EXAM:     RIGHT  LEFT  Peak Systolic Velocities (cm/s) End Diastolic Velocities (cm/s) Plaque LOCATION Peak Systolic Velocities (cm/s) End Diastolic Velocities (cm/s) Plaque  83 15  CCA PROXIMAL 90 21   67 17  CCA MID 78 24 HM  62 16 HT CCA DISTAL 82 25 HM  73 14 HT ECA 86 14   60 21 CP ICA PROXIMAL 60 15   75 25  ICA MID 45 19   59 21  ICA DISTAL 70 32     1.11 ICA / CCA Ratio (PSV)   Antegrade  Vertebral Flow Antegrade   A999333 Brachial Systolic Pressure (mmHg) A999333  Triphasic  Brachial Artery Waveforms Triphasic     Plaque Morphology:  HM = Homogeneous, HT = Heterogeneous, CP = Calcific Plaque, SP = Smooth Plaque, IP = Irregular Plaque     ADDITIONAL FINDINGS:     IMPRESSION: Right internal carotid artery velocities suggest a <40% stenosis. Patent left carotid endarterectomy site with evidence of minimal hyperplasia in the mid to distal common carotid artery without hemodynamically significant stenosis.     Compared to the previous exam:  No significant change in comparison to the last exam on 03/09/2012.      Assessment: KIE BIERLY is a 68 y.o. male who presents with asymptomatic minimal right ICA stenosis and patent left carotid endarterectomy site. The  ICA stenosis is  Unchanged from previous exam.  Plan: Follow-up in 1 year with Carotid Duplex scan.   I discussed in depth with the patient the nature of atherosclerosis, and emphasized the importance of maximal medical management including strict control of blood pressure, blood glucose, and lipid levels, obtaining regular exercise, and continued cessation of smoking.  The patient is aware that without maximal medical management the underlying atherosclerotic disease process will progress, limiting the benefit of any  interventions. The patient was given information about stroke prevention and what symptoms should prompt the patient to seek immediate medical care. Thank you for allowing [place] to participate in this patient's care.  Clemon Chambers, RN, MSN, FNP-C [facility] Office: [phone]  Clinic Physician: Kellie Simmering  04/04/2013 3:25 PM

## 2013-04-05 NOTE — Addendum Note (Signed)
Addended by: Dorthula Rue L on: 04/05/2013 02:43 PM   Modules accepted: Orders

## 2013-06-06 ENCOUNTER — Other Ambulatory Visit: Payer: Self-pay | Admitting: Family Medicine

## 2013-06-06 ENCOUNTER — Ambulatory Visit
Admission: RE | Admit: 2013-06-06 | Discharge: 2013-06-06 | Disposition: A | Discharge status: Home / Self Care | Payer: Medicare Other | Source: Ambulatory Visit | Attending: Family Medicine | Admitting: Family Medicine

## 2013-06-06 DIAGNOSIS — M25532 Pain in left wrist: Secondary | ICD-10-CM

## 2013-11-16 ENCOUNTER — Other Ambulatory Visit: Payer: Self-pay | Admitting: Dermatology

## 2014-04-11 ENCOUNTER — Ambulatory Visit: Payer: Medicare Other | Admitting: Family

## 2014-04-11 ENCOUNTER — Other Ambulatory Visit (HOSPITAL_COMMUNITY): Payer: Medicare Other

## 2014-05-15 ENCOUNTER — Encounter: Payer: Self-pay | Admitting: Vascular Surgery

## 2014-05-16 ENCOUNTER — Ambulatory Visit (HOSPITAL_COMMUNITY)
Admission: RE | Admit: 2014-05-16 | Discharge: 2014-05-16 | Disposition: A | Discharge status: Home / Self Care | Payer: Medicare Other | Source: Ambulatory Visit | Attending: Family | Admitting: Family

## 2014-05-16 ENCOUNTER — Encounter: Payer: Self-pay | Admitting: Family

## 2014-05-16 ENCOUNTER — Ambulatory Visit (INDEPENDENT_AMBULATORY_CARE_PROVIDER_SITE_OTHER): Payer: Medicare Other | Admitting: Family

## 2014-05-16 VITALS — BP 119/76 | HR 85 | Resp 16 | Ht 71.0 in | Wt 204.0 lb

## 2014-05-16 DIAGNOSIS — Z87891 Personal history of nicotine dependence: Secondary | ICD-10-CM | POA: Insufficient documentation

## 2014-05-16 DIAGNOSIS — I6521 Occlusion and stenosis of right carotid artery: Secondary | ICD-10-CM | POA: Insufficient documentation

## 2014-05-16 DIAGNOSIS — Z9889 Other specified postprocedural states: Secondary | ICD-10-CM

## 2014-05-16 DIAGNOSIS — Z48812 Encounter for surgical aftercare following surgery on the circulatory system: Secondary | ICD-10-CM | POA: Diagnosis not present

## 2014-05-16 DIAGNOSIS — I6523 Occlusion and stenosis of bilateral carotid arteries: Secondary | ICD-10-CM | POA: Diagnosis not present

## 2014-05-16 NOTE — Progress Notes (Signed)
Filed Vitals:   05/16/14 1430 05/16/14 1433 05/16/14 1443  BP: 129/75 122/83 119/76  Pulse: 89 88 85  Resp:  16   Height:  5\' 11"  (1.803 m)   Weight:  204 lb (92.534 kg)   SpO2:  98%

## 2014-05-16 NOTE — Patient Instructions (Signed)
Stroke Prevention Some medical conditions and behaviors are associated with an increased chance of having a stroke. You may prevent a stroke by making healthy choices and managing medical conditions. HOW CAN I REDUCE MY RISK OF HAVING A STROKE?   Stay physically active. Get at least 30 minutes of activity on most or all days.  Do not smoke. It may also be helpful to avoid exposure to secondhand smoke.  Limit alcohol use. Moderate alcohol use is considered to be:  No more than 2 drinks per day for men.  No more than 1 drink per day for nonpregnant women.  Eat healthy foods. This involves:  Eating 5 or more servings of fruits and vegetables a day.  Making dietary changes that address high blood pressure (hypertension), high cholesterol, diabetes, or obesity.  Manage your cholesterol levels.  Making food choices that are high in fiber and low in saturated fat, trans fat, and cholesterol may control cholesterol levels.  Take any prescribed medicines to control cholesterol as directed by your health care provider.  Manage your diabetes.  Controlling your carbohydrate and sugar intake is recommended to manage diabetes.  Take any prescribed medicines to control diabetes as directed by your health care provider.  Control your hypertension.  Making food choices that are low in salt (sodium), saturated fat, trans fat, and cholesterol is recommended to manage hypertension.  Take any prescribed medicines to control hypertension as directed by your health care provider.  Maintain a healthy weight.  Reducing calorie intake and making food choices that are low in sodium, saturated fat, trans fat, and cholesterol are recommended to manage weight.  Stop drug abuse.  Avoid taking birth control pills.  Talk to your health care provider about the risks of taking birth control pills if you are over 35 years old, smoke, get migraines, or have ever had a blood clot.  Get evaluated for sleep  disorders (sleep apnea).  Talk to your health care provider about getting a sleep evaluation if you snore a lot or have excessive sleepiness.  Take medicines only as directed by your health care provider.  For some people, aspirin or blood thinners (anticoagulants) are helpful in reducing the risk of forming abnormal blood clots that can lead to stroke. If you have the irregular heart rhythm of atrial fibrillation, you should be on a blood thinner unless there is a good reason you cannot take them.  Understand all your medicine instructions.  Make sure that other conditions (such as anemia or atherosclerosis) are addressed. SEEK IMMEDIATE MEDICAL CARE IF:   You have sudden weakness or numbness of the face, arm, or leg, especially on one side of the body.  Your face or eyelid droops to one side.  You have sudden confusion.  You have trouble speaking (aphasia) or understanding.  You have sudden trouble seeing in one or both eyes.  You have sudden trouble walking.  You have dizziness.  You have a loss of balance or coordination.  You have a sudden, severe headache with no known cause.  You have new chest pain or an irregular heartbeat. Any of these symptoms may represent a serious problem that is an emergency. Do not wait to see if the symptoms will go away. Get medical help at once. Call your local emergency services (911 in [place] Do not drive yourself to the hospital. Document Released: 02/21/2004 Document Revised: 05/30/2013 Document Reviewed: 07/16/2012 Exit[employer] Patient Information 2015 [employer]. This information is not intended to replace advice given   to you by your health care provider. Make sure you discuss any questions you have with your health care provider.  

## 2014-05-16 NOTE — Progress Notes (Signed)
Established Carotid Patient   History of Present Illness  [name] is a 69 y.o. male patient of Dr. Kellie Simmering who is s/p left carotid endarterectomy on 02/12/2011. This was an asymptomatic severe stenosis detected by Dr. Derinda Late. Patient has done well with no neurologic complications or specific symptoms related to his vasculature.  He returns today for routine surveillance.  Patient has Negative history of TIA or stroke symptom. The patient denies amaurosis fugax or monocular blindness. The patient denies facial drooping. Pt. denies hemiplegia. The patient denies receptive or expressive aphasia.  He denies any history of MI, denies any claudication symptoms.  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 Cervical fusion of C 4-6 by Dr. Jackolyn Confer at [facility]; he received immediate relief of symptoms.  Pt Diabetic: Yes, states last A1C was 5.7 Pt smoker: former smoker, quit in 1993, smoked for about 8 years   Pt meds include: Statin : Yes ASA: Yes Other anticoagulants/antiplatelets: no  Past Medical History  Diagnosis Date  . Carotid artery occlusion   . Hyperlipidemia     takes Lipitor daily  . Complication of anesthesia     found out allergic to ether at age 54 with tonsillectomy;coughing for 10days  . Hypertension     takes Verapamil daily and Losartan daily  . Pneumonia     double pneumonia as  a baby  . Concussion     at age 24  . Joint pain   . Herniated disc     diagnosed by Dr.Botero 15+yrs ago;no problems since  . Hepatitis     at age 60-Hep A  . Kidney stone     hx of in 2010  . Diabetes mellitus     takes Metformin and Januvia daily  . Anemia     at age 57 years old  . Cancer 2010    prostate    Social History History  Substance Use Topics  . Smoking status: Former Smoker -- 7 years    Types: Cigarettes    Quit date:  03/09/1989  . Smokeless tobacco: Never Used     Comment: quit 17yrs ago  . Alcohol Use: Yes     Comment: occ wine every wee    Family History Family History  Problem Relation Age of Onset  . Heart disease Mother   . Hypertension Mother   . Heart disease Father 44    Myocardial Infarction and Heart Disease before age 52  . Diabetes Father   . Hyperlipidemia Father   . Hypertension Father   . Heart attack Father   . Heart disease Brother     CABG history  . Stroke Brother   . Diabetes Brother   . Heart attack Brother   . Stroke Maternal Grandfather   . Stroke Paternal Grandfather   . Anesthesia problems Neg Hx   . Hypotension Neg Hx   . Malignant hyperthermia Neg Hx   . Pseudochol deficiency Neg Hx     Surgical History Past Surgical History  Procedure Laterality Date  . Prostate surgery  03-09-08    prostatectomy  . Tonsillectomy      as a child  . Appendectomy      at age 42  . Cyst removed      20+yrs ago  . Colonoscopy    . Endarterectomy  02/12/2011    Procedure: ENDARTERECTOMY CAROTID;  Surgeon: Jeneen Rinks  Rockwell Alexandria, MD;  Location: [facility];  Service: Vascular;  Laterality: Left;  . Carotid endarterectomy  Jan. 16,2013    LEFT  cea  . Spine surgery  Jun 10, 2012    cervical-  c-4  c-5 and c-6    Allergies  Allergen Reactions  . Penicillins Other (See Comments)    Does not work    Current Outpatient Prescriptions  Medication Sig Dispense Refill  . aspirin 81 MG tablet Take 81 mg by mouth 2 (two) times daily.     Marland Kitchen atorvastatin (LIPITOR) 10 MG tablet Take 40 mg by mouth 2 (two) times daily.     . chlorthalidone (HYGROTON) 25 MG tablet Take 25 mg by mouth daily.      Marland Kitchen GLIPIZIDE XL 5 MG 24 hr tablet Take by mouth 2 (two) times daily.    Marland Kitchen losartan (COZAAR) 100 MG tablet Take 100 mg by mouth daily.      . metFORMIN (GLUCOPHAGE) 500 MG tablet Take 1,000 mg by mouth 2 (two) times daily.     . metoprolol (LOPRESSOR) 50 MG tablet Take 50 mg by mouth 2 (two) times  daily.    . metoprolol succinate (TOPROL-XL) 50 MG 24 hr tablet Take 50 mg by mouth 2 (two) times daily. Take with or immediately following a meal.    . Multiple Vitamin (MULTIVITAMIN) tablet Take 1 tablet by mouth daily.      . Naproxen Sodium (ALEVE) 220 MG CAPS Take by mouth daily as needed.      . polyethylene glycol-electrolytes (NULYTELY/GOLYTELY) 420 G solution as needed.    . potassium chloride (K-DUR) 10 MEQ tablet Take 10 mEq by mouth 2 (two) times daily.     . sitaGLIPtin (JANUVIA) 100 MG tablet Take 100 mg by mouth daily.      . verapamil (COVERA HS) 240 MG (CO) 24 hr tablet Take 240 mg by mouth 2 (two) times daily.      No current facility-administered medications for this visit.    Review of Systems : See HPI for pertinent positives and negatives.  Physical Examination  Filed Vitals:   05/16/14 1430 05/16/14 1433 05/16/14 1443  BP: 129/75 122/83 119/76  Pulse: 89 88 85  Resp:  16   Height:  5\' 11"  (1.803 m)   Weight:  204 lb (92.534 kg)   SpO2:  98%    Body mass index is 28.46 kg/(m^2).   General: WDWN male in NAD GAIT: normal Eyes: PERRLA Pulmonary: Non-labored, CTAB, Negative Rales, Negative rhonchi, & Negative wheezing.  Cardiac: regular Rhythm, no detected murmur.  VASCULAR EXAM Carotid Bruits Left Right   Negative Negative    Radial pulses are 2+ palpable and equal.      LE Pulses LEFT RIGHT   POPLITEAL not not palpable  not palpable   POSTERIOR TIBIAL not palpable  not palpable    DORSALIS PEDIS  ANTERIOR TIBIAL palpable  palpable     Gastrointestinal: soft, nontender, BS WNL, no r/g, no palpable masses.  Musculoskeletal: Negative muscle atrophy/wasting. M/S 5/5 throughout, Extremities without ischemic changes.  Skin: macular pruritic red rash on palms of  hands  Neurologic: A&O X 3; Appropriate Affect, Speech is normal CN 2-12 intact, Pain and light touch intact in extremities, Motor exam as listed above.         Non-Invasive Vascular Imaging CAROTID DUPLEX 05/16/2014   CEREBROVASCULAR DUPLEX EVALUATION    Carotid artery disease    PREVIOUS INTERVENTION(S): Left carotid endarterectomy 02/12/2011  Carotid duplex    RIGHT  LEFT  End Diastolic Velocities (cm/s) Plaque LOCATION Peak Systolic Velocities (cm/s) End Diastolic Velocities (cm/s) Plaque  15 - CCA PROXIMAL 113 20 HM  18 - CCA MID 86 18 HM  19 HT CCA DISTAL 87 16 HM  14 HT ECA 95 14 HM  22 CP ICA PROXIMAL 63 17 -  24 - ICA MID 58 15 -  22 - ICA DISTAL 77 28 -    1.1 ICA / CCA Ratio (PSV) N/A  Antegrade Vertebral Flow Antegrade  794 Brachial Systolic Pressure (mmHg) 801  Triphasic Brachial Artery Waveforms Triphasic    Plaque Morphology:  HM = Homogeneous, HT = Heterogeneous, CP = Calcific Plaque, SP = Smooth Plaque, IP = Irregular Plaque  ADDITIONAL FINDINGS:     1. Less than 40% right internal carotid artery stenosis. 2. Patent left carotid endarterectomy site with no evidence for restenosis.    Compared to the previous exam:  No significant change      Assessment: [name] is a 69 y.o. male who is s/p left carotid endarterectomy on 02/12/2011. He has no history of stroke or TIA. Today's carotid Duplex suggests less than 40% right internal carotid artery stenosis and a patent left carotid endarterectomy site with no evidence for restenosis. No significant change from last Duplex a year ago.   Plan: Follow-up in 1 year with Carotid Duplex.   I discussed in depth with the patient the nature of atherosclerosis, and emphasized the importance of maximal medical management including strict control of blood pressure, blood glucose, and lipid levels, obtaining regular exercise, and continued cessation of smoking.  The patient is aware that without maximal  medical management the underlying atherosclerotic disease process will progress, limiting the benefit of any interventions. The patient was given information about stroke prevention and what symptoms should prompt the patient to seek immediate medical care. Thank you for allowing [place] to participate in this patient's care.  Clemon Chambers, RN, MSN, FNP-C [facility] Office: [phone]  Clinic Physician: Kellie Simmering  05/16/2014 2:09 PM

## 2014-05-22 NOTE — Addendum Note (Signed)
Addended by: Mena Goes on: 05/22/2014 04:35 PM   Modules accepted: Orders

## 2015-05-16 ENCOUNTER — Encounter: Payer: Self-pay | Admitting: Family

## 2015-05-22 ENCOUNTER — Other Ambulatory Visit: Payer: Self-pay | Admitting: Family

## 2015-05-22 ENCOUNTER — Encounter: Payer: Self-pay | Admitting: Family

## 2015-05-22 ENCOUNTER — Ambulatory Visit (INDEPENDENT_AMBULATORY_CARE_PROVIDER_SITE_OTHER): Payer: Medicare Other | Admitting: Family

## 2015-05-22 ENCOUNTER — Ambulatory Visit (HOSPITAL_COMMUNITY)
Admission: RE | Admit: 2015-05-22 | Discharge: 2015-05-22 | Disposition: A | Discharge status: Home / Self Care | Payer: Medicare Other | Source: Ambulatory Visit | Attending: Family | Admitting: Family

## 2015-05-22 VITALS — BP 126/80 | HR 84 | Temp 99.4°F | Resp 16 | Ht 77.0 in | Wt 194.0 lb

## 2015-05-22 DIAGNOSIS — I10 Essential (primary) hypertension: Secondary | ICD-10-CM | POA: Insufficient documentation

## 2015-05-22 DIAGNOSIS — Z9889 Other specified postprocedural states: Secondary | ICD-10-CM | POA: Diagnosis not present

## 2015-05-22 DIAGNOSIS — E785 Hyperlipidemia, unspecified: Secondary | ICD-10-CM | POA: Insufficient documentation

## 2015-05-22 DIAGNOSIS — I6522 Occlusion and stenosis of left carotid artery: Secondary | ICD-10-CM

## 2015-05-22 DIAGNOSIS — Z48812 Encounter for surgical aftercare following surgery on the circulatory system: Secondary | ICD-10-CM

## 2015-05-22 DIAGNOSIS — Z87891 Personal history of nicotine dependence: Secondary | ICD-10-CM | POA: Diagnosis not present

## 2015-05-22 DIAGNOSIS — I6521 Occlusion and stenosis of right carotid artery: Secondary | ICD-10-CM | POA: Insufficient documentation

## 2015-05-22 DIAGNOSIS — E119 Type 2 diabetes mellitus without complications: Secondary | ICD-10-CM | POA: Insufficient documentation

## 2015-05-22 NOTE — Progress Notes (Signed)
Chief Complaint: Extracranial Carotid Artery Stenosis   History of Present Illness  [name] is a 70 y.o. male patient of Dr. Kellie Simmering who is s/p left carotid endarterectomy on 02/12/2011. This was an asymptomatic severe stenosis detected by Dr. Derinda Late. Patient has done well with no neurologic complications or specific symptoms related to his vasculature.  He returns today for routine surveillance.  The patient denies any history of TIA or stroke symptoms.Specifically he denies a history of amaurosis fugax or monocular blindness, unilateral facial drooping, hemiparesis, or receptive or expressive aphasia.  He denies any history of MI, denies any claudication symptoms.  He and his wife exercise most days of the week.  Pt states he has white coat syndrome, states his blood pressure at home is [id number] systolic.  He contracted possible Coxackie virus in 2016 on his palms  And feet from his grandchild, macular pruritic red rash resolved with prednisone treatment. Cervical fusion of C 4-6 by Dr. Jackolyn Confer at [facility]; he received immediate relief of symptoms.  Pt Diabetic: Yes, states last A1C was 8.8 in February 2017, was started on insulin Pt smoker: former smoker, quit in 1991, smoked for about 7 years   Pt meds include: Statin : Yes ASA: Yes Other anticoagulants/antiplatelets: no    Past Medical History  Diagnosis Date  . Carotid artery occlusion   . Hyperlipidemia     takes Lipitor daily  . 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 Hypertension     takes Verapamil daily and Losartan daily  . Pneumonia     double pneumonia as  a baby  . Concussion     at age 14  . Joint pain   . Herniated disc     diagnosed by Dr.Botero 15+yrs ago;no problems since  . Hepatitis     at age 66-Hep A  . Kidney stone     hx of in 2010  . Diabetes mellitus     takes Metformin and Januvia  daily  . Anemia     at age 70 years old  . Cancer [facility]) 2010    prostate    Social History Social History  Substance Use Topics  . Smoking status: Former Smoker -- 7 years    Types: Cigarettes    Quit date: 03/09/1989  . Smokeless tobacco: Never Used     Comment: quit 72yrs ago  . Alcohol Use: Yes     Comment: occ wine every wee    Family History Family History  Problem Relation Age of Onset  . Heart disease Mother 48    After age 72 - Valve replacement  . Hypertension Mother   . Heart disease Father 66    Myocardial Infarction and Heart Disease before age 10  . Diabetes Father   . Hyperlipidemia Father   . Hypertension Father   . Heart attack Father   . Heart disease Brother     After age 45 -CABG history  . Stroke Brother   . Diabetes Brother   . Heart attack Brother   . Hypertension Brother   . Stroke Maternal Grandfather   . Stroke Paternal Grandfather   . Anesthesia problems Neg Hx   . Hypotension Neg Hx   . Malignant hyperthermia Neg Hx   . Pseudochol deficiency Neg Hx     Surgical History Past Surgical History  Procedure Laterality Date  . Prostate  surgery  03-09-08    prostatectomy  . Tonsillectomy      as a child  . Appendectomy      at age 53  . Cyst removed      20+yrs ago  . Colonoscopy    . Endarterectomy  02/12/2011    Procedure: ENDARTERECTOMY CAROTID;  Surgeon: Mal Misty, MD;  Location: [facility];  Service: Vascular;  Laterality: Left;  . Carotid endarterectomy  Jan. 16,2013    LEFT  cea  . Spine surgery  Jun 10, 2012    cervical-  c-4  c-5 and c-6    Allergies  Allergen Reactions  . Penicillins Other (See Comments)    Does not work    Current Outpatient Prescriptions  Medication Sig Dispense Refill  . aspirin 81 MG tablet Take 81 mg by mouth 2 (two) times daily.     Marland Kitchen atorvastatin (LIPITOR) 10 MG tablet Take 40 mg by mouth daily.     . chlorthalidone (HYGROTON) 25 MG tablet Take 25 mg by mouth daily.      Marland Kitchen GLIPIZIDE XL 5 MG  24 hr tablet Take 10 mg by mouth 2 (two) times daily.     . Insulin Detemir (LEVEMIR FLEXPEN) 100 UNIT/ML Pen Inject into the skin.    Marland Kitchen losartan (COZAAR) 100 MG tablet Take 100 mg by mouth daily.      . metFORMIN (GLUCOPHAGE) 500 MG tablet Take 1,000 mg by mouth 2 (two) times daily.     . metoprolol succinate (TOPROL-XL) 50 MG 24 hr tablet Take 50 mg by mouth 2 (two) times daily. Take with or immediately following a meal.    . Multiple Vitamin (MULTIVITAMIN) tablet Take 1 tablet by mouth daily.      . Naproxen Sodium (ALEVE) 220 MG CAPS Take by mouth daily as needed.      . potassium chloride (K-DUR) 10 MEQ tablet Take 10 mEq by mouth 2 (two) times daily.     . sitaGLIPtin (JANUVIA) 100 MG tablet Take 100 mg by mouth daily.       No current facility-administered medications for this visit.    Review of Systems : See HPI for pertinent positives and negatives.  Physical Examination  Filed Vitals:   05/22/15 1559 05/22/15 1612  BP: 140/83 126/80  Pulse: 82 84  Temp: 99.4 F (37.4 C)   TempSrc: Oral   Resp: 16   Height: 6\' 5"  (1.956 m)   Weight: 194 lb (87.998 kg)   SpO2: 99%    Body mass index is 23 kg/(m^2).  General: WDWN male in NAD GAIT: normal Eyes: PERRLA Pulmonary: Respirations are non-labored, CTAB.  Cardiac: regular rhythm, no detected murmur.  VASCULAR EXAM Carotid Bruits Left Right   Negative Negative    Radial pulses are 2+ palpable and equal.      LE Pulses LEFT RIGHT   POPLITEAL not not palpable  not palpable   POSTERIOR TIBIAL not palpable  not palpable    DORSALIS PEDIS  ANTERIOR TIBIAL palpable  palpable     Gastrointestinal: soft, nontender, BS WNL, no r/g, no palpable masses.  Musculoskeletal: No muscle atrophy/wasting. M/S 5/5 throughout, Extremities  without ischemic changes.  Skin: no rash or lesions  Neurologic: A&O X 3; Appropriate Affect, Speech is normal CN 2-12 intact, Pain and light touch intact in extremities, Motor exam as listed above.               Non-Invasive Vascular Imaging CAROTID DUPLEX 05/22/2015  Right ICA: <40% stenosis. Left ICA: CEA site with <40% stenosis. No significant change compared to prior exam.   Assessment: [name] is a 70 y.o. male who is s/p left carotid endarterectomy on 02/12/2011. He has no history of stroke or TIA. Today's carotid duplex suggests <40% bilateral ICA stenosis, no significant change compared to prior exam.    Plan: Follow-up in 1 year with Carotid Duplex scan.   I discussed in depth with the patient the nature of atherosclerosis, and emphasized the importance of maximal medical management including strict control of blood pressure, blood glucose, and lipid levels, obtaining regular exercise, and continued cessation of smoking.  The patient is aware that without maximal medical management the underlying atherosclerotic disease process will progress, limiting the benefit of any interventions. The patient was given information about stroke prevention and what symptoms should prompt the patient to seek immediate medical care. Thank you for allowing [place] to participate in this patient's care.  Clemon Chambers, RN, MSN, FNP-C [facility] Office: [phone]  Clinic Physician: Early  05/22/2015 4:22 PM

## 2015-05-22 NOTE — Patient Instructions (Signed)
Stroke Prevention Some medical conditions and behaviors are associated with an increased chance of having a stroke. You may prevent a stroke by making healthy choices and managing medical conditions. HOW CAN I REDUCE MY RISK OF HAVING A STROKE?   Stay physically active. Get at least 30 minutes of activity on most or all days.  Do not smoke. It may also be helpful to avoid exposure to secondhand smoke.  Limit alcohol use. Moderate alcohol use is considered to be:  No more than 2 drinks per day for men.  No more than 1 drink per day for nonpregnant women.  Eat healthy foods. This involves:  Eating 5 or more servings of fruits and vegetables a day.  Making dietary changes that address high blood pressure (hypertension), high cholesterol, diabetes, or obesity.  Manage your cholesterol levels.  Making food choices that are high in fiber and low in saturated fat, trans fat, and cholesterol may control cholesterol levels.  Take any prescribed medicines to control cholesterol as directed by your health care provider.  Manage your diabetes.  Controlling your carbohydrate and sugar intake is recommended to manage diabetes.  Take any prescribed medicines to control diabetes as directed by your health care provider.  Control your hypertension.  Making food choices that are low in salt (sodium), saturated fat, trans fat, and cholesterol is recommended to manage hypertension.  Ask your health care provider if you need treatment to lower your blood pressure. Take any prescribed medicines to control hypertension as directed by your health care provider.  If you are 18-39 years of age, have your blood pressure checked every 3-5 years. If you are 40 years of age or older, have your blood pressure checked every year.  Maintain a healthy weight.  Reducing calorie intake and making food choices that are low in sodium, saturated fat, trans fat, and cholesterol are recommended to manage  weight.  Stop drug abuse.  Avoid taking birth control pills.  Talk to your health care provider about the risks of taking birth control pills if you are over 35 years old, smoke, get migraines, or have ever had a blood clot.  Get evaluated for sleep disorders (sleep apnea).  Talk to your health care provider about getting a sleep evaluation if you snore a lot or have excessive sleepiness.  Take medicines only as directed by your health care provider.  For some people, aspirin or blood thinners (anticoagulants) are helpful in reducing the risk of forming abnormal blood clots that can lead to stroke. If you have the irregular heart rhythm of atrial fibrillation, you should be on a blood thinner unless there is a good reason you cannot take them.  Understand all your medicine instructions.  Make sure that other conditions (such as anemia or atherosclerosis) are addressed. SEEK IMMEDIATE MEDICAL CARE IF:   You have sudden weakness or numbness of the face, arm, or leg, especially on one side of the body.  Your face or eyelid droops to one side.  You have sudden confusion.  You have trouble speaking (aphasia) or understanding.  You have sudden trouble seeing in one or both eyes.  You have sudden trouble walking.  You have dizziness.  You have a loss of balance or coordination.  You have a sudden, severe headache with no known cause.  You have new chest pain or an irregular heartbeat. Any of these symptoms may represent a serious problem that is an emergency. Do not wait to see if the symptoms will   go away. Get medical help at once. Call your local emergency services (911 in [place] Do not drive yourself to the hospital.   This information is not intended to replace advice given to you by your health care provider. Make sure you discuss any questions you have with your health care provider.   Document Released: 02/21/2004 Document Revised: 02/03/2014 Document Reviewed:  07/16/2012 Elsevier Interactive Patient Education 2016 [employer]  

## 2015-06-26 NOTE — Addendum Note (Signed)
Addended by: Mena Goes on: 06/26/2015 04:23 PM   Modules accepted: Orders

## 2016-05-20 ENCOUNTER — Encounter: Payer: Self-pay | Admitting: Family

## 2016-05-27 ENCOUNTER — Encounter (HOSPITAL_COMMUNITY): Payer: Medicare Other

## 2016-05-27 ENCOUNTER — Ambulatory Visit: Payer: Medicare Other | Admitting: Family
